# Patient Record
Sex: Female | Born: 1970 | Race: Black or African American | Hispanic: No | Marital: Married | Smoking: Never smoker
Health system: Southern US, Community
[De-identification: ages and names within clinical notes are randomized; demographics above are authoritative.]

## PROBLEM LIST (undated history)

## (undated) DIAGNOSIS — E119 Type 2 diabetes mellitus without complications: Secondary | ICD-10-CM

## (undated) DIAGNOSIS — R1900 Intra-abdominal and pelvic swelling, mass and lump, unspecified site: Secondary | ICD-10-CM

## (undated) DIAGNOSIS — L309 Dermatitis, unspecified: Secondary | ICD-10-CM

## (undated) DIAGNOSIS — Z9289 Personal history of other medical treatment: Secondary | ICD-10-CM

## (undated) DIAGNOSIS — I10 Essential (primary) hypertension: Secondary | ICD-10-CM

## (undated) DIAGNOSIS — R7303 Prediabetes: Secondary | ICD-10-CM

## (undated) DIAGNOSIS — E7800 Pure hypercholesterolemia, unspecified: Secondary | ICD-10-CM

## (undated) HISTORY — PX: ECTOPIC PREGNANCY SURGERY: SHX613

## (undated) HISTORY — DX: Intra-abdominal and pelvic swelling, mass and lump, unspecified site: R19.00

---

## 1998-03-25 ENCOUNTER — Emergency Department (HOSPITAL_COMMUNITY): Admission: EM | Admit: 1998-03-25 | Discharge: 1998-03-25 | Payer: Self-pay | Admitting: Emergency Medicine

## 1998-03-31 ENCOUNTER — Emergency Department (HOSPITAL_COMMUNITY): Admission: EM | Admit: 1998-03-31 | Discharge: 1998-03-31 | Payer: Self-pay | Admitting: Emergency Medicine

## 1999-05-03 ENCOUNTER — Emergency Department (HOSPITAL_COMMUNITY): Admission: EM | Admit: 1999-05-03 | Discharge: 1999-05-03 | Payer: Self-pay | Admitting: Emergency Medicine

## 1999-06-21 ENCOUNTER — Emergency Department (HOSPITAL_COMMUNITY): Admission: EM | Admit: 1999-06-21 | Discharge: 1999-06-21 | Payer: Self-pay | Admitting: Internal Medicine

## 2001-10-04 ENCOUNTER — Inpatient Hospital Stay (HOSPITAL_COMMUNITY): Admission: AD | Admit: 2001-10-04 | Discharge: 2001-10-06 | Payer: Self-pay | Admitting: Obstetrics & Gynecology

## 2001-10-04 ENCOUNTER — Encounter: Payer: Self-pay | Admitting: Emergency Medicine

## 2001-11-03 ENCOUNTER — Other Ambulatory Visit: Admission: RE | Admit: 2001-11-03 | Discharge: 2001-11-03 | Payer: Self-pay | Admitting: Obstetrics and Gynecology

## 2003-11-08 ENCOUNTER — Emergency Department (HOSPITAL_COMMUNITY): Admission: EM | Admit: 2003-11-08 | Discharge: 2003-11-08 | Payer: Self-pay | Admitting: Emergency Medicine

## 2003-12-14 ENCOUNTER — Other Ambulatory Visit: Admission: RE | Admit: 2003-12-14 | Discharge: 2003-12-14 | Payer: Self-pay | Admitting: Obstetrics and Gynecology

## 2004-04-13 ENCOUNTER — Emergency Department (HOSPITAL_COMMUNITY): Admission: EM | Admit: 2004-04-13 | Discharge: 2004-04-13 | Payer: Self-pay | Admitting: Family Medicine

## 2004-08-12 ENCOUNTER — Encounter (INDEPENDENT_AMBULATORY_CARE_PROVIDER_SITE_OTHER): Payer: Self-pay | Admitting: Specialist

## 2004-08-12 ENCOUNTER — Inpatient Hospital Stay (HOSPITAL_COMMUNITY): Admission: AD | Admit: 2004-08-12 | Discharge: 2004-08-17 | Payer: Self-pay | Admitting: Obstetrics and Gynecology

## 2004-09-30 HISTORY — PX: BOWEL RESECTION: SHX1257

## 2004-10-31 ENCOUNTER — Ambulatory Visit (HOSPITAL_COMMUNITY): Admission: RE | Admit: 2004-10-31 | Discharge: 2004-10-31 | Payer: Self-pay | Admitting: General Surgery

## 2004-12-20 ENCOUNTER — Inpatient Hospital Stay (HOSPITAL_COMMUNITY): Admission: RE | Admit: 2004-12-20 | Discharge: 2004-12-26 | Payer: Self-pay | Admitting: General Surgery

## 2004-12-20 ENCOUNTER — Encounter (INDEPENDENT_AMBULATORY_CARE_PROVIDER_SITE_OTHER): Payer: Self-pay | Admitting: Specialist

## 2005-01-15 ENCOUNTER — Emergency Department (HOSPITAL_COMMUNITY): Admission: EM | Admit: 2005-01-15 | Discharge: 2005-01-15 | Payer: Self-pay | Admitting: Family Medicine

## 2005-08-21 ENCOUNTER — Emergency Department (HOSPITAL_COMMUNITY): Admission: EM | Admit: 2005-08-21 | Discharge: 2005-08-21 | Payer: Self-pay | Admitting: Family Medicine

## 2006-01-09 ENCOUNTER — Emergency Department (HOSPITAL_COMMUNITY): Admission: EM | Admit: 2006-01-09 | Discharge: 2006-01-10 | Payer: Self-pay | Admitting: Emergency Medicine

## 2006-01-15 ENCOUNTER — Emergency Department (HOSPITAL_COMMUNITY): Admission: EM | Admit: 2006-01-15 | Discharge: 2006-01-15 | Payer: Self-pay | Admitting: Family Medicine

## 2006-04-26 ENCOUNTER — Emergency Department (HOSPITAL_COMMUNITY): Admission: EM | Admit: 2006-04-26 | Discharge: 2006-04-26 | Payer: Self-pay | Admitting: Emergency Medicine

## 2007-05-22 ENCOUNTER — Emergency Department (HOSPITAL_COMMUNITY): Admission: EM | Admit: 2007-05-22 | Discharge: 2007-05-22 | Payer: Self-pay | Admitting: Family Medicine

## 2007-10-02 ENCOUNTER — Emergency Department (HOSPITAL_COMMUNITY): Admission: EM | Admit: 2007-10-02 | Discharge: 2007-10-02 | Payer: Self-pay | Admitting: Emergency Medicine

## 2008-04-29 ENCOUNTER — Emergency Department (HOSPITAL_COMMUNITY): Admission: EM | Admit: 2008-04-29 | Discharge: 2008-04-29 | Payer: Self-pay | Admitting: Emergency Medicine

## 2009-03-24 ENCOUNTER — Emergency Department (HOSPITAL_COMMUNITY): Admission: EM | Admit: 2009-03-24 | Discharge: 2009-03-24 | Payer: Self-pay | Admitting: Family Medicine

## 2011-02-15 NOTE — Discharge Summary (Signed)
Renee Oconnor, Renee Oconnor NO.:  [id number]   MEDICAL RECORD NO.:  [id number]          PATIENT TYPE:  INP   LOCATION:  0462                         FACILITY:  [facility]   PHYSICIAN:  Angelia Mould. Derrell Lolling, M.D.DATE OF BIRTH:  07-15-1971   DATE OF ADMISSION:  12/20/2004  DATE OF DISCHARGE:  12/26/2004                                 DISCHARGE SUMMARY   FINAL DIAGNOSES:  Functioning sigmoid colostomy.   OPERATION PERFORMED:  Exploratory laparotomy with resection and closure of  colostomy December 20, 2004.   HISTORY:  This is a 40 year old black female who was taken to the operating  room on August 13, 2004 for ectopic pregnancy.  Ectopic pregnancy was  operated upon and a uterine fibroid was removed and in the process of  removing the uterine fibroid through the cul-de-sac a full thickness  laceration of the rectovaginal septum occurred.  This was a contaminated  wound with stool present.  I came to the operating room and assisted Dr.  Stefano Gaul with layered closure of the rectovaginal septum injury and then a  laparoscopic colostomy was performed to divert the fecal stream and allow  adequate healing of this complex repair.  She did well.  She had undergone  evaluation as an outpatient.  She has undergone a bowel prep at home and is  brought to the hospital electively for a colostomy closure.   PHYSICAL EXAMINATION:  GENERAL:  Morbidly obese black female in no distress,  pleasant, friendly.  NECK:  No adenopathy or mass.  LUNGS:  Clear to auscultation.  HEART:  Regular rate and rhythm.  No murmur.  PERIPHERAL PULSES:  Palpable.  ABDOMEN:  Morbidly obese, soft, and nontender.  Well healed trocar site.  Healthy colostomy in the left lower quadrant.  EXTREMITIES:  She moves all four extremities well without deformity.  Her  arms and legs are very large.   HOSPITAL COURSE:  On the day of admission patient was taken to the operating  room.  We performed diagnostic  laparoscopy, but found that we could not  complete the procedure laparoscopically due to extensive dense adhesions.  The case was converted to an open laparotomy and we did resect the colostomy  and perform a primary anastomosis.  That surgery went well.  Postoperatively  she did well.  She had an ileus for two or three days but then we were able  to get the NG tube out.  She began passing flatus, advanced in her diet and  activities and she was ready to go home on December 26, 2004.  At the time she  went home she was afebrile, tolerating a regular diet, having bowel  movements,  was comfortable.  Her wounds were healing fine.  Pathology report on her  resected colostomy stoma showed benign colonic tissue.  She was asked to  follow up with me in the office in five to six days for staple removal and  was given a prescription for Vicodin for pain.      HMI/MEDQ  D:  01/17/2005  T:  01/17/2005  Job:  [id number]   cc:  Janine Limbo, M.D.  [address]  [place]  [place] [address]  Fax: [phone]

## 2011-02-15 NOTE — H&P (Signed)
NAME:  Renee Oconnor, Renee Oconnor NO.:  [id number]   MEDICAL RECORD NO.:  [id number]          PATIENT TYPE:  INP   LOCATION:  0004                         FACILITY:  [facility]   PHYSICIAN:  Angelia Mould. Derrell Lolling, M.D.DATE OF BIRTH:  Sep 21, 1971   DATE OF ADMISSION:  12/20/2004  DATE OF DISCHARGE:                                HISTORY & PHYSICAL   CHIEF COMPLAINT:  I want my colostomy closed.   HISTORY OF PRESENT ILLNESS:  This is a 40 year old black female who has a  history of an ectopic pregnancy.  She was taken to the operating room on  August 13, 2004 by Dr. Stefano Gaul.  He removed an ectopic pregnancy of the  right fallopian tube and removed an uterine fibroid and noticed a huge left  hydrosalpinx.  In the process of removing the uterine fibroid through the  cul de sac, the patient sustained a full-thickness, complex rectovaginal  laceration in the absence of a bowel prep.  I came to the operating room at  that time and assisted Dr. Stefano Gaul, who performed a very nice multilayer  closure of the rectovaginal septum.  I then performed a laparoscopic sigmoid  colostomy.  She did well postoperatively and has felt fine.  Dr. Stefano Gaul  feels that the rectovaginal septum has healed nicely.  She has had a  flexible sigmoidoscopy which shows a long rectosigmoid stump up to 30 cm.  She has had a barium enema, which otherwise looked normal.  She is brought  to the operating room electively for closure of her colostomy.   PAST MEDICAL HISTORY:  1.  Ectopic pregnancy, August 13, 2004.  2.  Left hydrosalpinx, significant.  3.  She is gravida 2, para 1, abortus 1 female, otherwise no significant      medical or surgical problems other than morbid obesity.  4.  She does have borderline hypertension.   CURRENT MEDICATIONS:  1.  Hydrochlorothiazide 25 mg daily.  2.  Iron.   DRUG ALLERGIES:  None known.   SOCIAL HISTORY:  She denies the use of alcohol or tobacco.   FAMILY HISTORY:   Mother, father, brother, and sister are living and well.   REVIEW OF SYSTEMS:  All systems are reviewed and noncontributory, except as  described above .   PHYSICAL EXAMINATION:  VITAL SIGNS:  Height 5 feet 8.  Weight 270 pounds.  Temp 98, pulse 99, respirations 18, blood pressure 130/70.  GENERAL:  A morbidly obese black female in no distress.  Pleasant and  friendly.  HEENT:  Eyes:  Sclerae are clear.  Extraocular movements are intact.  Ears,  nose, mouth, throat, nose, lips, tongue, and oropharynx without gross  lesions.  NECK:  Soft and nontender.  Short.  No palpable mass.  LUNGS:  Clear to auscultation.  No chest wall tenderness.  BREASTS:  Not examined.  HEART:  Regular rate and rhythm.  No murmur.  Radial and femoral pulses are  palpable.  ABDOMEN:  Morbidly obese.  Soft and nontender.  Well-healed trocar sites.  Healthy colostomy in the left lower quadrant.  EXTREMITIES:  Her arms and legs are  very large.  She moves them well without  deformity.  NEUROLOGIC:  No gross motor or sensory deficits.   ASSESSMENT:  1.  Functioning sigmoid colostomy, status post temporary diversion.  2.  History of ectopic pregnancy with rectovaginal septum injury.  3.  Morbid obesity.   PLAN:  The patient will be taken to the operating room for elective  laparoscopy and colostomy closure.  She has undergone a bowel prep at home.      HMI/MEDQ  D:  12/20/2004  T:  12/20/2004  Job:  [id number]   cc:   Janine Limbo, M.D.  [address], [address]  [place]  [place] [address]  Fax: [phone]

## 2011-02-15 NOTE — Consult Note (Signed)
[facility]  Patient:    Renee Oconnor, Renee Oconnor Visit Number: [id number] MRN: [id number]          Service Type: EMS Location: ED Attending Physician:  Shelba Flake Dictated by:   Maris Berger. Pennie Rushing, M.D. Proc. Date: 10/04/01 Consultation Date: 10/04/01 Admit Date:  10/03/2001 Discharge Date: 10/04/2001                            Consultation Report  DATE OF BIRTH:  11-Jul-1971  HISTORY OF PRESENT ILLNESS:  The patient is a 40 year old black married female gravida 1, para 0 who presented to the [facility] Emergency Department at 8:10 on the evening of October 03, 2001.  I was called at 10:30 on the morning of October 04, 2001 with a report of patient with the presentation of right lower quadrant pain, positive pregnancy test, and no intrauterine pregnancy on ultrasound.  She was reported to have stable vital signs except for an admission temperature of 101, which had subsequently become afebrile.  On evaluation of the patient at that time, she was in no acute distress and actually denied any current abdominal pain.  She stated that her abdominal pain, which had actually begun on Friday, October 02, 2001, had been a 10/10, but after approximately an hour was resolved without any medication.  She now rates her pain at a 1-2/10.  She denies any nausea, vomiting, syncope.  PHYSICAL EXAMINATION:  VITAL SIGNS:  Her blood pressure was 131/86, her pulse was 82.  ABDOMEN:  The abdomen was soft without masses or organomegaly.  There was no specific tenderness.  There was mild rebound in the right lower quadrant. Bowel sounds were normal.  PELVIC:  EGBUS, showed a small amount of blood.  The cervix was tender to lateral motion.  The uterus did not feel enlarged but the examination was compromised by patient habitus.  Adnexa had no specific masses with mild tenderness, greater on the right than on the left.  LABORATORY STUDIES:   Hemoglobin 12.4, white blood cell count 7.7, beta-hCG 1597.  IMPRESSION:  Probable right tubal ectopic pregnancy.  DISPOSITION:  A decision is made to transfer the patient to [facility] to further manage her probable gynecologic illness.  The patient and her husband were apprised of this recommendation and agreed to use [facility] for transfer to maternity admissions at [facility]. Dictated by:   Maris Berger. Pennie Rushing, M.D. Attending Physician:  Shelba Flake DD:  10/04/01 TD:  10/05/01 Job: [id number]

## 2011-02-15 NOTE — Op Note (Signed)
NAME:  Renee Oconnor, Renee Oconnor NO.:  [id number]   MEDICAL RECORD NO.:  [id number]          PATIENT TYPE:  INP   LOCATION:  0004                         FACILITY:  [facility]   PHYSICIAN:  Angelia Mould. Derrell Lolling, M.D.DATE OF BIRTH:  08-21-1971   DATE OF PROCEDURE:  12/20/2004  DATE OF DISCHARGE:                                 OPERATIVE REPORT   PREOPERATIVE DIAGNOSIS:  Functioning sigmoid colostomy.   POSTOPERATIVE DIAGNOSIS:  Functioning sigmoid colostomy.   OPERATION PERFORMED:  Diagnostic laparoscopy, exploratory laparotomy with  extensive lysis of adhesions, resection and closure of colostomy.   SURGEON:  Dr. Claud Kelp   FIRST ASSISTANT:  Dr. Avel Peace   OPERATIVE INDICATIONS:  This is a 40 year old black female, who presented 4  months ago to [facility] with an ectopic pregnancy.  She underwent  diagnostic laparoscopy and resection of her ectopic pregnancy on the right  side.  She was also found to have a uterine fibroid which was resected and  was noted to have a very large hydrosalpinx on the left.  Dr. Stefano Gaul  brought the specimens out through the cul-de-sac but in doing so, the  patient sustained a complex laceration of the rectovaginal septum with  unprepped bowel.  Dr. Stefano Gaul and I repaired that and then because of the  high risk of rectovaginal fistula, I performed a diverting colostomy  laparoscopically.  She did very well.  I have studied her rectum with both  proctoscope as well as a barium enema, and everything looks fine.  She is  brought to operating room electively following a bowel prep.   OPERATIVE FINDINGS:  The patient is morbidly obese which made visualization  with laparoscopy difficult.  She had extensive adhesions of the rectosigmoid  stump in the upper pelvis, being adherent to loops of small bowel.  There  was a loop of small bowel that was densely adherent to the umbilicus at the  trocar site, extremely fibrotic, and we were  unable to safely dissect this  with the laparoscope and for that reason, abandoned the laparoscopic  approach.  The rectal stump, when opened, had some slight cobblestoning of  the mucosa suggesting mild proctitis of disuse.  Blood supply to the rectal  stump and the sigmoid colon was fine, and the proximal colon mucosa was  normal.  The small bowel was otherwise normal.  The liver felt normal.  There was a huge left hydrosalpinx.  The right ovary looked basically  normal.   OPERATIVE TECHNIQUE:  Following the induction of general endotracheal  anesthesia, a Foley catheter was placed.  The abdomen was prepped and draped  in a sterile fashion.  Transverse incision was made just below the umbilicus  through the previous scar where the previous trocar was.  The fascia was  incised in the midline and the abdominal cavity entered under direct vision.  We saw that there was a loop of intestine just below this trocar site.  We  placed a 10 mm trocar in the upper midline and a couple of trocars on the  right side.  We tried to get around the  adhesion at the umbilicus and tried  to take this down, but is was so dense, it almost looked like it had  herniated into the abdominal wall, and so we abandoned the laparoscopic  approach at this point.  We pulled the trocars out and made a midline  incision, both above and below the umbilicus.  We dissected the loop of  small bowel out from under the umbilicus, and it was extremely fibrotic  adhesion.  But the small bowel was otherwise healthy, and there was no  injury to the small bowel.  We examined the rest the small bowel, and it  looked fine.  We went down in the pelvis and found the rectum and traced it  up to the distal sigmoid and dissected that away from the retroperitoneum  and the mesentery.  The adhesions there were also fairly intense, and we had  to very slowly dissect the rectosigmoid stump up.  Ultimately we had it  completely free and could  see the staple line where it had been divided.   We then made a transverse incision elliptically around the colostomy and  dissected it out of the subcutaneous tissue all the way down to the fascia.  We then dissected the colon all the way out of the fascia and returned it to  the abdominal cavity.  We resected about 4 inches of the colostomy because  that was how much was in her abdominal wall.  We simply placed a Satinsky  clamp across the bowel and transected the colon and removed it.  We  controlled the mesenteric vessels with clamps, divided them, and ligated  them with 2-0 silk ties.  We sent the colostomy remnant to the pathology  lab.   We then placed the Satinsky clamp on the rectosigmoid stump about 1 inch  below the staple line and transected that.  It looked healthy except for  some slight cobblestoning.   We set up the anastomosis.  We did it end-to-end with a single layer of 3-0  silks.  Inverting corner stitches of 3-0 silk were placed as holders.  We  then could close the back wall of the anastomosis with interrupted simple  sutures of 3-0 silk.  We inspected this very carefully and once we were  satisfied that we had good closure posteriorly, we cut all those sutures  except for the corner sutures.  We closed the corners with interrupted  inverting sutures of 3-0 silk and brought the anastomosis around anteriorly  and completed the anastomosis anteriorly with about 5-6 interrupted sutures  of 3-0 silk.  We checked all of the anastomosis.  The bowel looked perfectly  healthy.  The lumen was about 2 fingers wide and looked quite patent.   At this point, we changed our gowns, gloves, and instruments.  We copiously  irrigated the abdomen and pelvis with 2-3 L of saline.  We closed the  mesentery with interrupted sutures of 3-0 silk.  We took some of the mesenteric fat and appendices epiploica and sewed it down over the  anastomosis laterally and anteriorly.  There was no  tension on the  anastomosis, and the bowel was healthy.   At this point in the case, the nurses informed me that the sponge count was  incorrect.  There were 5 sponges unaccounted for which is the number sponges  in an entire pack.  Dr. Abbey Chatters and I explored the abdomen thoroughly on  several occasions and could find no sponges.  We took three separate x-rays  of the abdomen and pelvis, and there were no radiopaque foreign bodies.  It  was our opinion that this was a documentation error and that there was no  retained sponge in the patient.   We closed the peritoneum under the colostomy site with a running suture of 2-  0 Vicryl.  We closed the fascia at the colostomy site with five interrupted  sutures of #1 Novofil.  The midline fascia was closed with a running suture  of  #1 PDS.  The wounds were copiously irrigated.  The skin incision were all  closed with skin staples.  Clean bandages were placed and the patient taken  to the recovery room in stable condition.  Estimated blood loss was about  250 mL.  Complications were none.  Sponge and instrument counts were  correct.      HMI/MEDQ  D:  12/20/2004  T:  12/20/2004  Job:  [id number]   cc:   Janine Limbo, M.D.  [address]  [place]  [place] [address]  Fax: [phone]

## 2011-02-15 NOTE — Op Note (Signed)
Renee Oconnor, Renee Oconnor NO.:  [id number]   MEDICAL RECORD NO.:  [id number]          PATIENT TYPE:  INP   LOCATION:  9319                          FACILITY:  [facility]   PHYSICIAN:  Angelia Mould. Derrell Lolling, M.D.DATE OF BIRTH:  07-18-1971   DATE OF PROCEDURE:  08/13/2004  DATE OF DISCHARGE:                                 OPERATIVE REPORT   PREOPERATIVE DIAGNOSIS:  Ectopic pregnancy.   POSTOPERATIVE DIAGNOSES:  1.  Ectopic pregnancy, right fallopian tube.  2.  Uterine fibroids.  3.  Complex rectovaginal laceration.   OPERATION PERFORMED:  1.  Intraoperative general surgery consultation.  2.  Rigid proctoscopy (post repair).  3.  Laparoscopic sigmoid colostomy.   SURGEON:  Angelia Mould. Derrell Lolling, M.D.   FIRST ASSISTANT:  Janine Limbo, M.D.   OPERATIVE INDICATION:  This is a 40 year old black female, who presented to  Dr. Stefano Gaul today with her second ectopic pregnancy.  She has been known to  have pelvic adhesions.  Dr. Stefano Gaul brought her to the operating room for  management of her ectopic pregnancy.  He performed a right salpingectomy.  He then performed a myomectomy for a uterine fibroid on the fundus of the  uterus.  In the process of performing a colpotomy to remove the fibroid, the  patient sustained a complex full-thickness rectovaginal laceration.  Dr.  Stefano Gaul asked me to come to the operating room to assist.   When I arrived in the operating room, Dr. Stefano Gaul showed me the injury.  There was about a 3 cm diameter irregular full-thickness laceration on the  posterior vaginal wall just below the cervix, full-thickness into the rectal  wall.  I scrubbed in and assisted him with the primary repair.  He performed  a very nice primary repair in three layers.   During the procedure I performed a rigid proctoscopy and found that the  repair was airtight and that the rectal wall looked good without any  stricturing or narrowing of the rectal lumen, and there was  no bleeding.  I  actually performed a rigid proctoscopy to above 20 cm, irrigating out all of  the stool completely from the rectosigmoid colon that I could see.  I  completed the proctoscopy and once again looked at the repair, and it looked  perfectly fine without any narrowing of the lumen.   At this point Dr. Stefano Gaul completed the layered repair of the rectovaginal  injury, irrigated out the vagina with dilute Betadine solution, and placed a  tenaculum on the cervix.   We then reprepped and redraped the patient to expose all of the abdomen.  There was a fresh transverse incision below the umbilicus where he had  performed an open-technique laparoscopy.  We opened this incision and  removed all of the sutures.  We placed a pursestring suture of 0 Vicryl and  then inserted a Hasson trocar and insufflated the abdomen to 15 mmHg.  The  video camera was inserted.  There was some bloody, watery fluid down in the  pelvis, but there was no fecal contamination.  We copiously irrigated the  pelvis.  We  noticed that the area of the myomectomy was perfectly hemostatic  on the fundus of the uterus.  The area of the right salpingectomy looked  fine.  The left tube and ovary were quite dilated with a huge chronic  hydrosalpinx.  We spent a great deal of time inspecting the cul-de-sac, and  everything looked fine, just a little bit of blood-staining from the repair.   I placed a 12 mm trocar in the lower midline and a 12 mm trocar above the  umbilicus in the midline.  Dr. Stefano Gaul had placed two 5 mm trocars in the  left and right suprapubic areas, and I ultimately had to insert 5 mm trocars  in these locations as well.   We identified the rectum and traced it proximally.  There were some small  bowel adhesions extending over to the left pelvic sidewall, and I had to use  the Harmonic scalpel to mobilize this completely to get the small bowel out  of the way.  I then rechecked the anatomy,  tracing the rectum all the way  proximally through the sigmoid colon up to the distal descending colon.  We  identified the cecum as well.   I mobilized the sigmoid colon by dividing its lateral peritoneal attachments  using the Harmonic scalpel.  After I had the sigmoid colon fully mobilized,  I created a window in the distal sigmoid colon mesentery with the Harmonic  scalpel and divided the distal sigmoid colon with a GIA stapling device.  We  took down the mesentery just a little bit to further mobilize the sigmoid  colon.  We scored the mesentery a little bit more and then ultimately we had  excellent mobilization of the sigmoid colon.   I did create a colostomy.  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  I felt that diversion was the safest course  considering the complex nature of the repair and the unprepped bowel.   I selected a site on the abdominal wall to the left of the umbilicus.  A  circular button of skin was excised and the underlying fat was dissected  away as well.  I incised the anterior rectus sheath in the colostomy site  with electrocautery.  The rectus sheath was incised in a cruciate fashion.  The rectus muscle was simply retracted medially and laterally.  The  posterior rectus sheath was likewise incised with electrocautery until I  could easily pass two fingers through the opening.  The sigmoid colon was  delivered through the colostomy site.  Great care was taken to avoid  twisting of the colostomy; in fact, this was inspected both externally and  internally on two or three occasions and a minor adjustment was made to  ensure that there was no twisting.  The colon was pink and healthy.  I  amputated the staple line from the sigmoid colon and then matured the colostomy with about 10 interrupted sutures of 3-0 Vicryl.  The colon mucosa  was perfectly pink, bled well,  and was quite healthy.  I was able to pass my  finger down through the colostomy into the abdomen.   A 19 [place] Blake drain was placed in the pelvis in the cul-de-sac.  This  was brought out through the right suprapubic trocar site and sutured to the  skin with nylon suture and connected to the suction bulb.  We made one final inspection of all areas of surgery and dissection.  The  small bowel and colon and retroperitoneum looked fine without any bleeding.  The trocars were removed under direct vision, and there was no bleeding from  the trocar sites.  The fascia at the umbilicus was closed with about five  interrupted sutures of 0 Vicryl.  The wounds were irrigated with saline and  closed with skin staples.  A colostomy bag was placed on the sigmoid  colostomy.  Clean bandages were placed and the patient was taken to the  recovery room in stable condition.  Estimated blood loss was probably around  100 mL.  Complications:  None.  Sponge, needle, and instrument counts were  correct.     Hayw   HMI/MEDQ  D:  08/13/2004  T:  08/13/2004  Job:  [id number]   cc:   Janine Limbo, M.D.  [address], [address]  [place]  [place] [address]  Fax: [phone]

## 2011-02-15 NOTE — Discharge Summary (Signed)
[facility]  Patient:    Renee Oconnor, Renee Oconnor Visit Number: [id number] MRN: [id number]          Service Type: MED Location: [id number] Attending Physician:  Shaune Spittle Dictated by:   Henreitta Leber, P.A. Admit Date:  10/04/2001 Discharge Date: 10/06/2001                             Discharge Summary  DISCHARGE DIAGNOSES: 1. Right tubal pregnancy. 2. Chronic pelvic inflammatory disease. 3. Elevated blood pressure. 4. Infertility.  PROCEDURES:  During the course of the patients hospital stay she received intravenous antibiotics and methotrexate.  HISTORY OF PRESENT ILLNESS:  Renee Oconnor is a 40 year old married black female, gravida 1, para 0, who presented to [facility] Emergency Department on the evening of October 03, 2001, due to severe right lower quadrant abdominal pain, a positive pregnancy test, and no intrauterine pregnancy on ultrasound.  She was then transferred to [facility] for further evaluation and management of her signs and symptoms. Please see the patients dictated history and physical examination for details.  PHYSICAL EXAMINATION:  VITAL SIGNS:  Blood pressure 131/86, pulse 82, temperature 99.6.  ABDOMEN:  The abdomen was soft without masses or organomegaly.  There was no specific tenderness.  There was mild rebound in the right lower quadrant. Bowel sounds were normal.  PELVIC:  EGBUS showed a small amount of blood.  The cervix was tender to lateral motion.  Uterus did not feel enlarged, but the examination was compromised by the patients habitus.  Adnexa had no specific masses with mild tenderness, greater on the right than on the left.  LABORATORY DATA:  Beta hCG of 1597, blood type A+.  Ultrasound did not reveal an intrauterine pregnancy, there was endometrial thickness of 6 mm; right ovary was normal; left adnexal mass consistent with hydrosalpinx with simple fluid  contents, 6 cm x 4 cm fluid collection on left without Doppler flow, and without yolk sac.  The patients hemoglobin was 12.4, white blood cell count 7.7, with 70% leukocytes.  HOSPITAL COURSE:  During the course of hospital stay, the patient continued to have intermittent abdominal pain with small amounts of vaginal bleeding.  She was noted to have a decrease in her quantitative Hcg on 10/06/01, to a level of 974.  After discussing options of management with the patient and her husband, she consented to methotrexate therapy.  Following its administration, the patient was discharged home to follow up with Dr. Pennie Rushing in two weeks.  DISCHARGE MEDICATIONS:  Doxycycline 100 mg p.o. b.i.d. x7 days.  FOLLOWUP:  The patient is to present to [facility] for quantitative Hcg on 10/09/01, 10/12/01, 10/19/01, 10/26/01, and follow up with Dr. Pennie Rushing on 10/21/01, at 1:30 p.m.  DISCHARGE INSTRUCTIONS:  The patient was advised to call [facility] for a temperature greater than 101, abdominal pain which is not relieved by over-the-counter ibuprofen.  She was further advised to place nothing in her vaginal vault (i.e. no douching, tampons, or intercourse).  The patient was instructed that she may return to work on 10/12/01. Dictated by:   Henreitta Leber, P.A. Attending Physician:  Shaune Spittle DD:  10/14/01 TD:  10/15/01 Job: [id number]

## 2011-02-15 NOTE — Discharge Summary (Signed)
[name], HEADINGS NO.:  [id number]   MEDICAL RECORD NO.:  [id number]          PATIENT TYPE:  INP   LOCATION:  9319                          [facility]ILITY[facility]   PHYSICIAN:  Janine Limbo, M.D.DATE OF BIRTH:  [birth year]   DATE OF ADMISSION:  08/12/2004  DATE OF DISCHARGE:  08/17/2004                                 DISCHARGE SUMMARY   DISCHARGE DIAGNOSES:  1.  Right ectopic pregnancy.  2.  Anemia.  3.  Hemoperitoneum.  4.  Pelvic adhesions.  5.  Fibroid uterus.  6.  Incidental transvaginal colotomy.  7.  Obesity.   OPERATION AND PROCEDURES:  On the date of admission, the patient underwent a  diagnostic laparoscopy with a partial right salpingectomy, lysis of  adhesions, and myomectomy.  The patient also underwent a colpotomy to remove  the fibroid from the pelvis, at which time an incidental colotomy was  encountered and repaired.  The patient underwent a repeat laparoscopy,  proctoscopy, and a laparoscopic sigmoid colostomy.   HISTORY OF PRESENT ILLNESS:  [name] is a 40 year old married  African-American female gravida 2 para 0-0-1-0 who presented to maternity  admissions unit at [facility] [place] on August 12, 2004  complaining of abdominal cramping and vaginal spotting.  The patient had a  previous history of ectopic pregnancy along with pelvic inflammatory  disease, and therefore was admitted for evaluation due to the fact that her  pregnancy test was positive.  Please see the patient's complete history on  her permanent record.   ADMISSION PHYSICAL EXAMINATION:  VITAL SIGNS:  Blood pressure 166/71, pulse  is 97, respirations 20, temperature 97.4 degrees Fahrenheit orally.  ABDOMEN:  The abdomen was soft with mild tenderness.  BACK:  Without CVA tenderness.  PELVIC:  External genitalia was normal, no lesions.  The cervix was long and  closed without motion tenderness.  Adnexa had tenderness on the right.  Vagina  revealed a small amount of blood in the vaginal vault.  The uterus  was not enlarged, though due to the patient's body habitus, it was difficult  to assess.   The patient's blood type is A positive.   HOSPITAL COURSE:  On the date of admission, the patient underwent  aforementioned procedures, tolerating them all well.  Operative findings  included a right ectopic pregnancy, pelvic adhesions, and a uterine fibroid.  An incidental colotomy was discovered and repaired.  A laparoscopic  colostomy was performed.  Postoperative course was also unremarkable, with  the patient tolerating a hemoglobin of 10.8 (preoperative hemoglobin 11.8).  By hospital day #5/postoperative day #4 the patient had resumed bladder  function, was tolerating a regular diet, had stool emerging from her  colostomy stoma, and was therefore deemed ready for discharge home.   DISCHARGE MEDICATIONS:  1.  Ibuprofen 600 mg one tablet with food q.6h. for 3 days, then as needed      for pain.  2.  Ferrous sulfate 325 mg one tablet twice daily for 6 weeks.   FOLLOW-UP:  The patient is scheduled for a follow-up visit with Dr. Stefano Gaul  on September 05, 2004 at 10:30 a.m.  She also was to call Dr. Derrell Lolling in 10-14  days for follow-up appointment at his office, [phone].   DISCHARGE INSTRUCTIONS:  The patient was advised to call [facility] at [phone] for any temperature greater than 100.4 degrees Fahrenheit  orally, severe pain, or concerns.  The patient's diet is without  restrictions.  Her activity is as tolerated.   FINAL PATHOLOGY:  1.  Pelvic adhesions:  Peritoneum with fibrosis consistent with adhesions.  2.  Right ectopic pregnancy:  Fallopian tube with chorionic villi consistent      with tubal ectopic pregnancy.  3.  Fibroid leiomyoma.     Elmi   EJP/MEDQ  D:  08/17/2004  T:  08/18/2004  Job:  [id number]   cc:   Angelia Mould. Derrell Lolling, M.D.  [address]  [place]  [place] [address]  Fax:  [phone]

## 2011-02-15 NOTE — H&P (Signed)
[facility]  Patient:    Renee Oconnor, Renee Oconnor Visit Number: [id number] MRN: [id number]          Service Type: EMS Location: ED Attending Physician:  Shelba Flake Dictated by:   Maris Berger. Pennie Rushing, M.D. Admit Date:  10/03/2001 Discharge Date: 10/04/2001                           History and Physical  HISTORY OF PRESENT ILLNESS:   The patient is a 40 year old married black female, gravida 1, para 0, without presented to the [facility] Emergency Department on October 03, 2001 at 8:10 p.m.  He was complaining of a two-day history of abdominal pain.  She had acute onset of right lower quadrant pain while working that she rated at 10/10, unassociated with nausea, vomiting, constipation, diarrhea or syncope.  She was at work at the time, and was able to be seated.  By the time her husband had arranged for her to return home approximately one hour later, she was without any abdominal pain at all.  She had taken no pain medication.  She had recurrence of the pain later on Friday evening and then once on Saturday, but no recurrence of that pain.  She presented to the emergency room because she could still feel tenderness on pressing on the right lower quadrant, and felt that she needed to evaluate what was going on.  She has no history of a prior pregnancy, and is using no contraception.  She has never used contraception.  Her last normal menstrual period was August 29, 2001, and she began having some bleeding in mid December, which was not a normal period, but which spotted off and on, and she continues that until this time.  PAST OBSTETRIC HISTORY:       Negative.  PAST GYNECOLOGIC HISTORY:     Her last Pap smear was in 1987, as was her last pelvic examination.  She had menarche at the age of 53, with regular monthly menses, never missing any menses and never having intermenstrual bleeding. She and her husband have been married  for nine years, and they do desire pregnancy, and have never used any form of contraception.  PAST MEDICAL HISTORY:         Negative.  PAST SURGICAL HISTORY:        Negative.  HOSPITALIZATIONS:             None.  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  CURRENT MEDICATIONS:          None.  DRUG SENSITIVITIES:           None.  REVIEW OF SYSTEMS:            Completely negative except as mentioned above for the right lower quadrant pain.  Specifically, she denies any dysuria, change in bowel habits or dyspareunia.  PHYSICAL EXAMINATION:  VITAL SIGNS:                  Temperature 99.6 (down from 101 at [facility]), blood pressure and pulse as recorded on the nursing note and without orthostatic change.  GENERAL:                      Well-developed, obese black female in no acute distress.  LUNGS:  Clear.  HEART:                        Regular rate and rhythm.  ABDOMEN:                      Soft without masses or tenderness to deep palpation.  There is some mild rebound in the right lower quadrant.  Bowel sounds are normal.  PELVIC:                       EGBUS shows a small amount of blood.  The cervix is tender to lateral motion.  The uterus does not feel enlarged, though the examination is compromised by patient habitus.  Adnexa show no palpable masses.  There is some mild tenderness, greater on the right than on the left.  LABORATORY DATA:              HCG 1597.  Hemoglobin 12.4, white blood cell count 7.7 with 70% polys.  Urinalysis is negative.  Blood type is A positive. Wet prep is negative.  Ultrasound shows no intrauterine pregnancy with an endometrial thickness of 6 mm.  The right ovary appears normal.  The left adnexa has a serpiginous mass consistent with a hydrosalpinx with simple fluid contents.  There is a 6 x 4 cm fluid collection in the left adnexa that has no Doppler  flow associated with it and no yolk sac.  This could be consistent with a tubo-ovarian complex.  IMPRESSION:                   1. Probable tubal ectopic pregnancy, most likely                                  on the right side where the patients right                                  lower quadrant pain exists.                               2. Left adnexal mass, probably inflammatory in                                  nature, with no ultrasound characteristics                                  consistent with ectopic pregnancy.                               3. No evidence of an acute abdomen.                               4. Hemodynamically stable.  DISPOSITION:                  A long discussion was held with the patient and her husband concerning options for management, which include surgical intervention with laparoscopy with the attendant risks of anesthesia, bleeding, infection  and damage to adjacent organs, specifically damage to reproductive organs in a patient who is nulliparous and desirous of subsequent pregnancy.  The second option (which was accepted by the patient) was methotrexate treatment of a probable tubal ectopic pregnancy and intravenous antibiotic therapy for a probable chronic pelvic inflammatory process.  The patient will be observed in the hospital for therapy of her pelvic inflammatory disease for approximately 48 hours, at which time a reassessment of her HCG level as well as her pelvic pain will be undertaken.  It is understood that pelvic examination is contraindicated after methotrexate therapy so that this will be delayed until a repeat pelvic examination and ultrasound evaluation can be performed.  The risk of rupture of tubal ectopic pregnancy was explained to the patient and she and her husband seem to understand, but wish to proceed with conservative management. Dictated by:   Maris Berger. Pennie Rushing, M.D. Attending Physician:  Shelba Flake DD:   10/04/01 TD:  10/04/01 Job: [id number]

## 2011-02-15 NOTE — Op Note (Signed)
NAMEALLEYNE, LAC NO.:  [id number]   MEDICAL RECORD NO.:  [id number]          PATIENT TYPE:  INP   LOCATION:  9319                          [facility]   PHYSICIAN:  Janine Limbo, M.D.DATE OF BIRTH:  08-03-1971   DATE OF PROCEDURE:  DATE OF DISCHARGE:                                 OPERATIVE REPORT   PREOPERATIVE DIAGNOSES:  1.  Right ectopic pregnancy.  2.  Anemia (hemoglobin 11.8).  3.  Obesity (weight 269 pounds).   POSTOPERATIVE DIAGNOSES:  1.  Right ectopic pregnancy.  2.  Anemia (hemoglobin 11.8).  3.  Obesity (weight 269 pounds).  4.  Hemoperitoneum.  5.  Pelvic adhesions.  6.  Fibroid uterus.  7.  Incidental colotomy.   OPERATION PERFORMED:  1.  Diagnostic laparoscopy.  2.  Laparoscopic right partial salpingectomy.  3.  Laparoscopic lysis of adhesions.  4.  Laparoscopic myomectomy.  5.  Culpotomy with repair.  6.  Repair of incidental colotomy.  7.  Proctoscopy.  8.  Repeat laparoscopy.  9.  Laparoscopic sigmoid colostomy.   SURGEON:  Janine Limbo, M.D.   INTRAOPERATIVE CONSULT:  Angelia Mould. Derrell Lolling, M.D.   ASSISTANT:   ANESTHESIA:  General.   COMPLICATIONS:   INDICATIONS FOR PROCEDURE:  Ms. Carreno is a 40 year old female, gravida 2,  para 0-0-1-0, who presents with abdominal pain and a positive pregnancy  test.  An ultrasound showed a viable right ectopic pregnancy.  The patient  had an ectopic pregnancy in 2003 that was treated with methotrexate.  She  has a history of infertility and a history of pelvic inflammatory disease.  The patient understands the indications for her surgical procedure and she  accepts the risk of, but not limited to, anesthetic complications, bleeding,  infections and possible damage to the surrounding organs.   FINDINGS:  The uterus was upper limits normal size.  There was a 2 to 3 cm  fibroid on the right fundus of the uterus.  Upon inspecting the pelvis, we  found 150 mL of  hemoperitoneum.  There was a bleeding right ectopic  pregnancy present.  The right fallopian tube was dilated throughout its  course.  There were dense adhesions in the pelvis.  There were also dense  adhesions between the bowel, the left pelvic sidewall, and the bowel and the  right pelvic sidewall.  The left fallopian tube and the left ovary were  densely adhered to the left pelvic sidewall.  There were dense adhesions  present in the posterior cul-de-sac.  There was a hydrosalpinx present on  the left and the fimbriated end of the left tube could not be identified.  There was a 2 to 3 cm incidental colotomy that was discovered at the time of  posterior culpotomy.   DESCRIPTION OF PROCEDURE:  The patient was taken to the operating room where  a general anesthetic was given.  The patient's abdomen, perineum, and vagina  were prepped with multiple layers of Betadine.  A Foley catheter was placed  in the bladder.  Examination under anesthesia was performed.  A single  toothed tenaculum was placed  on the cervix just in case there was a viable  intrauterine pregnancy present.  The patient was then sterilely draped.  The  subumbilical area was injected with 6 mL of 0.5% Marcaine with epinephrine.  A subumbilical incision was made and carried sharply through the  subcutaneous tissue, the fascia, and the anterior peritoneum.  The Hasson  cannula was sutured into place using 2-0 Vicryl.  A pneumoperitoneum was  obtained.  The laparoscope was inserted.  The pelvis was visualized with the  findings as mentioned above.  Two suprapubic areas were injected with a  total of 4 mL of 0.25% Marcaine with epinephrine.  Two small incisions were  made and two 5 mm trocars were placed in the lower abdomen under direct  visualization.  Pictures were taken of the patient's pelvic anatomy.  We  then began to lyse the adhesions in the pelvis using a combination of sharp  dissection and blunt dissection.  We were  able to completely mobilize the  right fallopian tube.  The right ovary was identified.  Again, pictures were  taken.  The right fallopian tube and the mesosalpinx were then cauterized  using the bipolar cautery. Care was taken not to damage any of the  underlying structures.  The right fallopian tube was then removed.  The  ectopic pregnancy was removed through the umbilical port.  Because the  patient desired fertility, we elected to try to lyse the adhesions of the  left ovary and the left tube so that the left ovary would be more accessible  in the future.  This was accomplished using a combination of sharp and blunt  dissection.  The fibroid on the right fundus of the uterus was then  isolated.  The base of the fibroid was cauterized using the bipolar cautery.  The fibroid was sharply dissected from the fundus of the uterus.  Hemostasis  was achieved using the bipolar cautery.  Attempts were made to remove the  fibroid through the umbilical port but these attempts were unsuccessful.  The posterior cul-de-sac was then freed from adhesions using a combination  of sharp and blunt dissection.  Again, pictures were taken.  The fibroid was  placed in the posterior cul-de-sac and the decision was made to remove the  fibroid through a posterior colpotomy.  The pelvis was vigorously irrigated.  Again hemostasis was noted to be adequate at this point.  The bowel was  inspected and there was no evidence of trocar damage.  The suprapubic  trocars were removed under direct visualization.  The umbilical trocar was  then removed.  The fascia from the umbilical incision was closed using  figure-of-eight sutures of 2-0 Vicryl.  The subcutaneous layer was closed  using 2-0 Vicryl.  The skin was reapproximated using a subcuticular suture  of 4-0 Vicryl.  The suprapubic ports were closed using a subcuticular suture  of 4-0 Vicryl.  The patient was then placed in a more lithotomy position. The vagina was  again prepped with Betadine.  A paracervical block was placed  using 10 mL of 0.5% Marcaine with epinephrine.  The posterior cervix and the  upper vagina was then injected with 10 mL of 0.5% Marcaine with epinephrine.  A culpotomy was then made.  It was at this point that we noticed that there  was stool  in the incision and we noticed that there was an incidental  colotomy.  The vagina was vigorously irrigated as was the colotomy.  Angelia Mould.  Derrell Lolling, M.D. from the General Surgery department was called to assist in  the patient's surgery.  Dr. Derrell Lolling inspected the incision and he questioned  how I felt I could help.  I was comfortable repairing the laceration from  the vagina.  The margins of the rectal incision was then carefully isolated.  The rectal mucosa was closed using interrupted sutures of 2-0 silk, 3-0  silk, and 2-0 Vicryl.  Dr. Derrell Lolling then performed proctoscopy and he was able  to inspect the incision.  The incision appeared to be well approximated.  Dr. Derrell Lolling will dictate this portion of the operative note.  We also noted  that the incision was air tight and that there was no air passing from the  bowel into the vagina. The vagina was then vigorously irrigated with  Betadine and irrigation fluid.  Hemostasis was adequate.  The fascia propria  that overlay the rectum was then reapproximated in the midline using 2-0  Vicryl.  The vaginal mucosa was then closed using interrupted sutures of 2-0  Vicryl.  Again the vagina was then carefully irrigated with Betadine  solution and irrigation fluid.  Hemostasis was adequate.  Dr. Derrell Lolling was  then ready to repeat the laparoscopy and it was his opinion that the patient  would best be served by having a colostomy.  Again, Dr. Derrell Lolling will dictate  this portion of the operative note.  There was no stool in the pelvic  cavity.  The rectum appeared normal.  After the colostomy was performed  using the laparoscopic technique, the incisions  were closed using deep  sutures of 0 Vicryl on the fascia and then skin staples.  At the end of the  procedure, an x-ray was obtained and there was no evidence of instruments or  sponges left in the abdominal cavity.  The estimated blood loss for this  procedure was 100 mL.  The operative time for the procedure was  approximately seven hours.  A colostomy bag was placed.  The patient was  awakened from her anesthetic without difficulty.  She was then taken to the  recovery room in stable condition.     Arth   AVS/MEDQ  D:  08/13/2004  T:  08/13/2004  Job:  [id number]   cc:   Angelia Mould. Derrell Lolling, M.D.  [address], [address]  [place]  [place] [address]  Fax: [phone]

## 2011-06-14 ENCOUNTER — Inpatient Hospital Stay (INDEPENDENT_AMBULATORY_CARE_PROVIDER_SITE_OTHER)
Admission: RE | Admit: 2011-06-14 | Discharge: 2011-06-14 | Disposition: A | Discharge status: Home / Self Care | Payer: BC Managed Care – PPO | Source: Ambulatory Visit | Attending: Emergency Medicine | Admitting: Emergency Medicine

## 2011-06-14 DIAGNOSIS — I10 Essential (primary) hypertension: Secondary | ICD-10-CM

## 2012-01-18 ENCOUNTER — Encounter (HOSPITAL_COMMUNITY): Payer: Self-pay | Admitting: *Deleted

## 2012-01-18 ENCOUNTER — Emergency Department (INDEPENDENT_AMBULATORY_CARE_PROVIDER_SITE_OTHER): Payer: BC Managed Care – PPO

## 2012-01-18 ENCOUNTER — Emergency Department (INDEPENDENT_AMBULATORY_CARE_PROVIDER_SITE_OTHER)
Admission: EM | Admit: 2012-01-18 | Discharge: 2012-01-18 | Disposition: A | Discharge status: Home / Self Care | Payer: BC Managed Care – PPO | Source: Home / Self Care | Attending: Emergency Medicine | Admitting: Emergency Medicine

## 2012-01-18 DIAGNOSIS — M719 Bursopathy, unspecified: Secondary | ICD-10-CM

## 2012-01-18 DIAGNOSIS — M67919 Unspecified disorder of synovium and tendon, unspecified shoulder: Secondary | ICD-10-CM

## 2012-01-18 DIAGNOSIS — M7580 Other shoulder lesions, unspecified shoulder: Secondary | ICD-10-CM

## 2012-01-18 HISTORY — DX: Essential (primary) hypertension: I10

## 2012-01-18 HISTORY — DX: Pure hypercholesterolemia, unspecified: E78.00

## 2012-01-18 MED ORDER — DICLOFENAC SODIUM 75 MG PO TBEC: 75.0000 mg | DELAYED_RELEASE_TABLET | Freq: Two times a day (BID) | ORAL | Status: AC

## 2012-01-18 NOTE — ED Notes (Signed)
Pt with c/o right shoulder pain radiates around right shoulder increased pain with movement  No known injury

## 2012-01-18 NOTE — Discharge Instructions (Signed)
Rotator Cuff Tendonitis  The rotator cuff is the collection of all the muscles and tendons (the supraspinatus, infraspinatus, subscapularis, and teres minor muscles and their tendons) that help your shoulder stay in place. This unit holds the head of the upper arm bone (humerus) in the cup (fossa) of the shoulder blade (scapula). Basically, it connects the arm to the shoulder. Tendinitis is a swelling and irritation of the tissue, called cord like structures (tendons) that connect muscle to bone. It usually is caused by overusing the joint involved. When the tissue surrounding a tendon (the synovium) becomes inflamed, it is called tenosynovitis. This also is often the result of overuse in people whose jobs require repetitive (over and over again) types of motion. HOME CARE INSTRUCTIONS   Use a sling or splint for as long as directed by your caregiver until the pain decreases.   Apply ice to the injury for 15 to 20 minutes, 3 to 4 times per day. Put the ice in a plastic bag and place a towel between the bag of ice and your skin.   Try to avoid use other than gentle range of motion while your shoulder is painful. Use and exercise only as directed by your caregiver. Stop exercises or range of motion if pain or discomfort increases, unless directed otherwise by your caregiver.   Only take over-the-counter or prescription medicines for pain, discomfort, or fever as directed by your caregiver.   If you were give a shoulder sling and straps (immobilizer), do not remove it except as directed, or until you see a caregiver for a follow-up examination. If you need to remove it, move your arm as little as possible or as directed.   You may want to sleep on several pillows at night to lessen swelling and pain.  SEEK IMMEDIATE MEDICAL CARE IF:   Pain in your shoulder increases or new pain develops in your arm, hand, or fingers and is not relieved with medications.   You develop new, unexplained symptoms,  especially increased numbness in the hands or loss of strength, or you develop any worsening of the problems which brought you in for care.   Your arm, hand, or fingers are numb or tingling.   Your arm, hand, or fingers are swollen, painful, or turn white or blue.  Document Released: 12/07/2003 Document Revised: 09/05/2011 Document Reviewed: 07/14/2008 Exit[employer] Patient Information 2012 [employer]. 

## 2012-01-18 NOTE — ED Provider Notes (Signed)
Chief Complaint  Patient presents with  . Shoulder Pain    History of Present Illness:   Renee Oconnor is a 41 year old female who has had a two-day history of right shoulder pain. She describes the pain as encompassing a rim around the shoulder both posteriorly and anteriorly. She denies any injury, but does do a lot of lifting at work and also has a tendency to sleep on her right side with her arm under her pillow. There is no pain with abduction and she has a full range of motion the shoulder. It does not radiate down the arm and she denies any numbness, tingling, or weakness. She denies any neck pain or chest pain.  Review of Systems:  Other than noted above, the patient denies any of the following symptoms: Systemic:  No fevers, chills, sweats, or aches.  No fatigue or tiredness. Musculoskeletal:  No joint pain, arthritis, bursitis, swelling, back pain, or neck pain. Neurological:  No muscular weakness, paresthesias, headache, or trouble with speech or coordination.  No dizziness.   PMFSH:  Past medical history, family history, social history, meds, and allergies were reviewed.  Physical Exam:   Vital signs:  BP 173/94  Pulse 90  Temp(Src) 97.2 F (36.2 C) (Oral)  Resp 22  SpO2 98%  LMP 01/09/2012 Gen:  Alert and oriented times 3.  In no distress. Musculoskeletal: There is no pain to palpation. The shoulder has a full range of motion with no pain. Neer test was negative, Hawkins test was negative, empty cans test was positive, but muscle strength is normal. Otherwise, all joints had a full a ROM with no swelling, bruising or deformity.  No edema, pulses full. Extremities were warm and pink.  Capillary refill was brisk.  Skin:  Clear, warm and dry.  No rash. Neuro:  Alert and oriented times 3.  Muscle strength was normal.  Sensation was intact to light touch.   Radiology:  Dg Shoulder Right  01/18/2012  *RADIOLOGY REPORT*  Clinical Data: Shoulder pain for 2 days.  Limited range of motion  due to pain.  RIGHT SHOULDER - 2+ VIEW  Comparison: None.  Findings: The humerus is located and the acromioclavicular joint is intact.  There is no fracture.  No notable degenerative change. Imaged right lung and ribs are unremarkable.  IMPRESSION: Negative study.  Original Report Authenticated By: Bernadene Bell. Maricela Curet, M.D.    Assessment:  The encounter diagnosis was Rotator cuff tendonitis.  Plan:   1.  The following meds were prescribed:   New Prescriptions   DICLOFENAC (VOLTAREN) 75 MG EC TABLET    Take 1 tablet (75 mg total) by mouth 2 (two) times daily.   2.  The patient was instructed in symptomatic care, including rest and activity, elevation, application of ice and compression.  Appropriate handouts were given. 3.  The patient was told to return if becoming worse in any way, if no better in 3 or 4 days, and given some red flag symptoms that would indicate earlier return.   4.  The patient was told to follow up Dr. Trudee Grip if no better in 2 weeks. She was given exercises to do in the meantime.   Reuben Likes, MD 01/18/12 [phone]

## 2012-09-05 ENCOUNTER — Emergency Department (INDEPENDENT_AMBULATORY_CARE_PROVIDER_SITE_OTHER)
Admission: EM | Admit: 2012-09-05 | Discharge: 2012-09-05 | Disposition: A | Discharge status: Home / Self Care | Payer: BC Managed Care – PPO | Source: Home / Self Care | Attending: Family Medicine | Admitting: Family Medicine

## 2012-09-05 ENCOUNTER — Encounter (HOSPITAL_COMMUNITY): Payer: Self-pay | Admitting: *Deleted

## 2012-09-05 DIAGNOSIS — K089 Disorder of teeth and supporting structures, unspecified: Secondary | ICD-10-CM

## 2012-09-05 DIAGNOSIS — K0889 Other specified disorders of teeth and supporting structures: Secondary | ICD-10-CM

## 2012-09-05 MED ORDER — CLINDAMYCIN HCL 150 MG PO CAPS: 150.0000 mg | ORAL_CAPSULE | Freq: Four times a day (QID) | ORAL | Status: DC

## 2012-09-05 MED ORDER — DICLOFENAC POTASSIUM 50 MG PO TABS: 50.0000 mg | ORAL_TABLET | Freq: Three times a day (TID) | ORAL | Status: DC

## 2012-09-05 NOTE — ED Provider Notes (Signed)
History     CSN: [id number]  Arrival date & time 09/05/12  1233   First MD Initiated Contact with Patient 09/05/12 1436      Chief Complaint  Patient presents with  . Dental Pain    (Consider location/radiation/quality/duration/timing/severity/associated sxs/prior treatment) Patient is a 41 y.o. female presenting with tooth pain. The history is provided by the patient.  Dental PainThe primary symptoms include mouth pain. Primary symptoms do not include fever or sore throat. The symptoms began 2 days ago. The symptoms are worsening. The symptoms are new. The symptoms occur constantly.  Additional symptoms include: dental sensitivity to temperature and jaw pain. Additional symptoms do not include: trouble swallowing and pain with swallowing.    Past Medical History  Diagnosis Date  . Hypertension   . Diabetes mellitus   . High cholesterol     Past Surgical History  Procedure Date  . Ectopic pregnancy surgery     Family History  Problem Relation Age of Onset  . Family history unknown: Yes    History  Substance Use Topics  . Smoking status: Never Smoker   . Smokeless tobacco: Not on file  . Alcohol Use: No    OB History    Grav Para Term Preterm Abortions TAB SAB Ect Mult Living                  Review of Systems  Constitutional: Negative.  Negative for fever.  HENT: Positive for dental problem. Negative for sore throat and trouble swallowing.     Allergies  Review of patient's allergies indicates no known allergies.  Home Medications   Current Outpatient Rx  Name  Route  Sig  Dispense  Refill  . AMLODIPINE BESYLATE-VALSARTAN 10-160 MG PO TABS   Oral   Take 1 tablet by mouth daily.         Marland Kitchen CLINDAMYCIN HCL 150 MG PO CAPS   Oral   Take 1 capsule (150 mg total) by mouth 4 (four) times daily.   28 capsule   0   . DICLOFENAC POTASSIUM 50 MG PO TABS   Oral   Take 1 tablet (50 mg total) by mouth 3 (three) times daily.   15 tablet   0   .  DICLOFENAC SODIUM 75 MG PO TBEC   Oral   Take 1 tablet (75 mg total) by mouth 2 (two) times daily.   20 tablet   0   . METFORMIN HCL 500 MG PO TABS   Oral   Take 500 mg by mouth 2 (two) times daily with a meal.         . ROSUVASTATIN CALCIUM 10 MG PO TABS   Oral   Take 10 mg by mouth daily.           BP 159/81  Pulse 93  Temp 99 F (37.2 C) (Oral)  Resp 16  SpO2 100%  Physical Exam  Nursing note and vitals reviewed. Constitutional: She appears well-developed and well-nourished. She appears distressed.  HENT:  Head: Normocephalic.  Right Ear: External ear normal.  Left Ear: External ear normal.  Mouth/Throat: Oropharynx is clear and moist and mucous membranes are normal.      ED Course  Procedures (including critical care time)  Labs Reviewed - No data to display No results found.   1. Pain, dental       MDM          Linna Hoff, MD 09/05/12 1501

## 2012-09-05 NOTE — ED Notes (Signed)
Pt reports abscess tooth/dental pain on left side - is currently trying to get dental appointment for monday

## 2012-09-07 NOTE — ED Notes (Signed)
Patient c/o her pain and swelling is worse. Called to [facility] , oral surgery office, and they can see her tomorrow at 3:30pm. Called patient to discuss, and she will call to discuss appointmnet

## 2015-07-20 ENCOUNTER — Emergency Department (INDEPENDENT_AMBULATORY_CARE_PROVIDER_SITE_OTHER)
Admission: EM | Admit: 2015-07-20 | Discharge: 2015-07-20 | Disposition: A | Discharge status: Home / Self Care | Payer: BLUE CROSS/BLUE SHIELD | Source: Home / Self Care | Attending: Family Medicine | Admitting: Family Medicine

## 2015-07-20 ENCOUNTER — Emergency Department (INDEPENDENT_AMBULATORY_CARE_PROVIDER_SITE_OTHER): Payer: BLUE CROSS/BLUE SHIELD

## 2015-07-20 ENCOUNTER — Encounter (HOSPITAL_COMMUNITY): Payer: Self-pay | Admitting: Family Medicine

## 2015-07-20 DIAGNOSIS — K5909 Other constipation: Secondary | ICD-10-CM

## 2015-07-20 LAB — POCT URINALYSIS DIP (DEVICE)
Bilirubin Urine: NEGATIVE
Glucose, UA: NEGATIVE mg/dL
KETONES UR: NEGATIVE mg/dL
Nitrite: NEGATIVE
PH: 6 (ref 5.0–8.0)
Protein, ur: 100 mg/dL — AB
Specific Gravity, Urine: 1.015 (ref 1.005–1.030)
Urobilinogen, UA: 1 mg/dL (ref 0.0–1.0)

## 2015-07-20 LAB — POCT PREGNANCY, URINE: PREG TEST UR: NEGATIVE

## 2015-07-20 MED ORDER — PEG 3350-KCL-NA BICARB-NACL 420 G PO SOLR: 4000.0000 mL | Freq: Once | ORAL | Status: DC

## 2015-07-20 NOTE — ED Notes (Signed)
C/o mid abd pain for a week now States belly feels tight Has not had a good bowel movement

## 2015-07-20 NOTE — Discharge Instructions (Signed)
Her symptoms are likely from constipation. Please stay at home tomorrow and use the GoLYTELY over the course of a couple of hours in order to promote your bowels to move. For approximately 1 week after this use MiraLAX every morning and use plenty of yogurt over the next several weeks to promote replenishment of her gut bacteria.   Constipation, Adult Constipation is when a person has fewer than three bowel movements a week, has difficulty having a bowel movement, or has stools that are dry, hard, or larger than normal. As people grow older, constipation is more common. A low-fiber diet, not taking in enough fluids, and taking certain medicines may make constipation worse.  CAUSES   Certain medicines, such as antidepressants, pain medicine, iron supplements, antacids, and water pills.   Certain diseases, such as diabetes, irritable bowel syndrome (IBS), thyroid disease, or depression.   Not drinking enough water.   Not eating enough fiber-rich foods.   Stress or travel.   Lack of physical activity or exercise.   Ignoring the urge to have a bowel movement.   Using laxatives too much.  SIGNS AND SYMPTOMS   Having fewer than three bowel movements a week.   Straining to have a bowel movement.   Having stools that are hard, dry, or larger than normal.   Feeling full or bloated.   Pain in the lower abdomen.   Not feeling relief after having a bowel movement.  DIAGNOSIS  Your health care provider will take a medical history and perform a physical exam. Further testing may be done for severe constipation. Some tests may include:  A barium enema X-ray to examine your rectum, colon, and, sometimes, your small intestine.   A sigmoidoscopy to examine your lower colon.   A colonoscopy to examine your entire colon. TREATMENT  Treatment will depend on the severity of your constipation and what is causing it. Some dietary treatments include drinking more fluids and eating  more fiber-rich foods. Lifestyle treatments may include regular exercise. If these diet and lifestyle recommendations do not help, your health care provider may recommend taking over-the-counter laxative medicines to help you have bowel movements. Prescription medicines may be prescribed if over-the-counter medicines do not work.  HOME CARE INSTRUCTIONS   Eat foods that have a lot of fiber, such as fruits, vegetables, whole grains, and beans.  Limit foods high in fat and processed sugars, such as french fries, hamburgers, cookies, candies, and soda.   A fiber supplement may be added to your diet if you cannot get enough fiber from foods.   Drink enough fluids to keep your urine clear or pale yellow.   Exercise regularly or as directed by your health care provider.   Go to the restroom when you have the urge to go. Do not hold it.   Only take over-the-counter or prescription medicines as directed by your health care provider. Do not take other medicines for constipation without talking to your health care provider first.  [place] IF:   You have bright red blood in your stool.   Your constipation lasts for more than 4 days or gets worse.   You have abdominal or rectal pain.   You have thin, pencil-like stools.   You have unexplained weight loss. MAKE SURE YOU:   Understand these instructions.  Will watch your condition.  Will get help right away if you are not doing well or get worse.   This information is not intended to replace advice  given to you by your health care provider. Make sure you discuss any questions you have with your health care provider.   Document Released: 06/14/2004 Document Revised: 10/07/2014 Document Reviewed: 06/28/2013 Elsevier Interactive Patient Education [employer].

## 2015-07-20 NOTE — ED Provider Notes (Addendum)
CSN: [id number]     Arrival date & time 07/20/15  1301 History   First MD Initiated Contact with Patient 07/20/15 1317     Chief Complaint  Patient presents with  . Abdominal Pain   (Consider location/radiation/quality/duration/timing/severity/associated sxs/prior Treatment) HPI  A days of abdominal discomfort. Described as a tight and full sensation. Center lower abdominal region. Nonradiating. Fairly constant with waxing and waning nature. Denies any epigastric pain, dysuria, frequency, nausea, vomiting, fevers, back pain. No change with meals. Milk of magnesia with improved bowels which relieves symptoms for short period of time. Patient's doing only every 3-4 days. 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 She had a diverting colostomy for approximately 5 months. Since renal anastomosis of her: She's had no bowel problems.   Past Medical History  Diagnosis Date  . Hypertension   . Diabetes mellitus   . High cholesterol    Past Surgical History  Procedure Laterality Date  . Ectopic pregnancy surgery     Family History  Problem Relation Age of Onset  . Family history unknown: Yes   Social History  Substance Use Topics  . Smoking status: Never Smoker   . Smokeless tobacco: None  . Alcohol Use: No   OB History    No data available     Review of Systems Per HPI with all other pertinent systems negative.   Allergies  Review of patient's allergies indicates no known allergies.  Home Medications   Prior to Admission medications   Medication Sig Start Date End Date Taking? Authorizing Provider  hydrochlorothiazide (MICROZIDE) 12.5 MG capsule Take 12.5 mg by mouth daily.   Yes Historical Provider, MD  diclofenac (CATAFLAM) 50 MG tablet Take 1 tablet (50 mg total) by mouth 3 (three) times daily. 09/05/12   Billy Fischer, MD  metFORMIN (GLUCOPHAGE) 500 MG tablet Take 500 mg by mouth  2 (two) times daily with a meal.    Historical Provider, MD  polyethylene glycol-electrolytes (NULYTELY/GOLYTELY) 420 G solution Take 4,000 mLs by mouth once. 07/20/15   Waldemar Dickens, MD  rosuvastatin (CRESTOR) 10 MG tablet Take 10 mg by mouth daily.    Historical Provider, MD   Meds Ordered and Administered this Visit  Medications - No data to display  BP 141/82 mmHg  Pulse 113  Temp(Src) 99 F (37.2 C) (Oral)  Resp 22  SpO2 100%  LMP 07/19/2015 No data found.   Physical Exam Physical Exam  Constitutional: oriented to person, place, and time. appears well-developed and well-nourished. No distress.  HENT:  Head: Normocephalic and atraumatic.  Eyes: EOMI. PERRL.  Neck: Normal range of motion.  Cardiovascular: RRR, no m/r/g, 2+ distal pulses,  Pulmonary/Chest: Effort normal and breath sounds normal. No respiratory distress.  Abdominal: Obese and difficult to appreciate bowel sounds and/or other pathology. Nontender to palpation.  Musculoskeletal: Normal range of motion. Non ttp, no effusion.  Neurological: alert and oriented to person, place, and time.  Skin: Skin is warm. No rash noted. non diaphoretic.  Psychiatric: normal mood and affect. behavior is normal. Judgment and thought content normal.   ED Course  Procedures (including critical care time)  Labs Review Labs Reviewed - No data to display  Imaging Review No results found.   Visual Acuity Review  Right Eye Distance:   Left Eye Distance:   Bilateral Distance:    Right Eye Near:   Left Eye  Near:    Bilateral Near:         MDM   1. Other constipation     KUB without acute process.  KUB was obtained in part due to patient's complex bowel history including diverting colostomy and reanastomosis. GoLYTELY.  discussed further dietary management and use of medication such as MiraLAX and probiotics in order to prevent this from happening again.   Waldemar Dickens, MD 07/20/15 1343  Waldemar Dickens,  MD 07/20/15 [phone]

## 2015-07-28 ENCOUNTER — Inpatient Hospital Stay (HOSPITAL_COMMUNITY)
Admission: EM | Admit: 2015-07-28 | Discharge: 2015-08-02 | DRG: 872 | Disposition: A | Discharge status: Home / Self Care | Payer: BLUE CROSS/BLUE SHIELD | Attending: Family Medicine | Admitting: Family Medicine

## 2015-07-28 ENCOUNTER — Encounter (HOSPITAL_COMMUNITY): Payer: Self-pay | Admitting: Emergency Medicine

## 2015-07-28 ENCOUNTER — Ambulatory Visit (HOSPITAL_COMMUNITY): Payer: BLUE CROSS/BLUE SHIELD

## 2015-07-28 DIAGNOSIS — K50114 Crohn's disease of large intestine with abscess: Secondary | ICD-10-CM

## 2015-07-28 DIAGNOSIS — K529 Noninfective gastroenteritis and colitis, unspecified: Secondary | ICD-10-CM | POA: Diagnosis present

## 2015-07-28 DIAGNOSIS — Z9049 Acquired absence of other specified parts of digestive tract: Secondary | ICD-10-CM

## 2015-07-28 DIAGNOSIS — A419 Sepsis, unspecified organism: Principal | ICD-10-CM | POA: Diagnosis present

## 2015-07-28 DIAGNOSIS — D649 Anemia, unspecified: Secondary | ICD-10-CM | POA: Insufficient documentation

## 2015-07-28 DIAGNOSIS — D473 Essential (hemorrhagic) thrombocythemia: Secondary | ICD-10-CM | POA: Diagnosis present

## 2015-07-28 DIAGNOSIS — Z6841 Body Mass Index (BMI) 40.0 and over, adult: Secondary | ICD-10-CM

## 2015-07-28 DIAGNOSIS — D509 Iron deficiency anemia, unspecified: Secondary | ICD-10-CM | POA: Diagnosis present

## 2015-07-28 DIAGNOSIS — R1013 Epigastric pain: Secondary | ICD-10-CM | POA: Diagnosis not present

## 2015-07-28 DIAGNOSIS — L309 Dermatitis, unspecified: Secondary | ICD-10-CM | POA: Diagnosis present

## 2015-07-28 DIAGNOSIS — N839 Noninflammatory disorder of ovary, fallopian tube and broad ligament, unspecified: Secondary | ICD-10-CM | POA: Diagnosis not present

## 2015-07-28 DIAGNOSIS — R109 Unspecified abdominal pain: Secondary | ICD-10-CM | POA: Diagnosis present

## 2015-07-28 DIAGNOSIS — D62 Acute posthemorrhagic anemia: Secondary | ICD-10-CM | POA: Diagnosis present

## 2015-07-28 DIAGNOSIS — E7800 Pure hypercholesterolemia, unspecified: Secondary | ICD-10-CM | POA: Diagnosis present

## 2015-07-28 DIAGNOSIS — D75839 Thrombocytosis, unspecified: Secondary | ICD-10-CM | POA: Diagnosis present

## 2015-07-28 DIAGNOSIS — J189 Pneumonia, unspecified organism: Secondary | ICD-10-CM

## 2015-07-28 DIAGNOSIS — E119 Type 2 diabetes mellitus without complications: Secondary | ICD-10-CM | POA: Diagnosis present

## 2015-07-28 DIAGNOSIS — N838 Other noninflammatory disorders of ovary, fallopian tube and broad ligament: Secondary | ICD-10-CM | POA: Diagnosis present

## 2015-07-28 DIAGNOSIS — R100 Acute abdomen: Secondary | ICD-10-CM | POA: Diagnosis not present

## 2015-07-28 DIAGNOSIS — Z79899 Other long term (current) drug therapy: Secondary | ICD-10-CM

## 2015-07-28 DIAGNOSIS — I10 Essential (primary) hypertension: Secondary | ICD-10-CM | POA: Diagnosis present

## 2015-07-28 LAB — URINE MICROSCOPIC-ADD ON

## 2015-07-28 LAB — COMPREHENSIVE METABOLIC PANEL
ALT: 44 U/L (ref 14–54)
ANION GAP: 12 (ref 5–15)
AST: 54 U/L — AB (ref 15–41)
Albumin: 2.4 g/dL — ABNORMAL LOW (ref 3.5–5.0)
Alkaline Phosphatase: 83 U/L (ref 38–126)
BUN: 9 mg/dL (ref 6–20)
CHLORIDE: 100 mmol/L — AB (ref 101–111)
CO2: 23 mmol/L (ref 22–32)
Calcium: 8.6 mg/dL — ABNORMAL LOW (ref 8.9–10.3)
Creatinine, Ser: 0.97 mg/dL (ref 0.44–1.00)
GFR calc Af Amer: >60 mL/min (ref >60)
Glucose, Bld: 105 mg/dL — ABNORMAL HIGH (ref 65–99)
POTASSIUM: 3.7 mmol/L (ref 3.5–5.1)
Sodium: 135 mmol/L (ref 135–145)
Total Bilirubin: 0.7 mg/dL (ref 0.3–1.2)
Total Protein: 8.4 g/dL — ABNORMAL HIGH (ref 6.5–8.1)

## 2015-07-28 LAB — URINALYSIS, ROUTINE W REFLEX MICROSCOPIC
Bilirubin Urine: NEGATIVE
Glucose, UA: NEGATIVE mg/dL
Ketones, ur: NEGATIVE mg/dL
LEUKOCYTES UA: NEGATIVE
Nitrite: NEGATIVE
PROTEIN: NEGATIVE mg/dL
Specific Gravity, Urine: 1.029 (ref 1.005–1.030)
UROBILINOGEN UA: 1 mg/dL (ref 0.0–1.0)
pH: 6 (ref 5.0–8.0)

## 2015-07-28 LAB — CBC WITH DIFFERENTIAL/PLATELET
BASOS ABS: 0 10*3/uL (ref 0.0–0.1)
Basophils Relative: 0 %
EOS PCT: 0 %
Eosinophils Absolute: 0 10*3/uL (ref 0.0–0.7)
HCT: 21.8 % — ABNORMAL LOW (ref 36.0–46.0)
Hemoglobin: 6.4 g/dL — CL (ref 12.0–15.0)
Lymphocytes Relative: 10 %
Lymphs Abs: 1.2 10*3/uL (ref 0.7–4.0)
MCH: 19.6 pg — ABNORMAL LOW (ref 26.0–34.0)
MCHC: 29.4 g/dL — ABNORMAL LOW (ref 30.0–36.0)
MCV: 66.9 fL — AB (ref 78.0–100.0)
MONO ABS: 1.2 10*3/uL — AB (ref 0.1–1.0)
MONOS PCT: 10 %
NEUTROS PCT: 80 %
Neutro Abs: 9.8 10*3/uL — ABNORMAL HIGH (ref 1.7–7.7)
PLATELETS: 888 10*3/uL — AB (ref 150–400)
RBC: 3.26 MIL/uL — AB (ref 3.87–5.11)
RDW: 17.8 % — AB (ref 11.5–15.5)
WBC: 12.2 10*3/uL — AB (ref 4.0–10.5)

## 2015-07-28 LAB — PROCALCITONIN: PROCALCITONIN: 0.27 ng/mL

## 2015-07-28 LAB — PREPARE RBC (CROSSMATCH)

## 2015-07-28 LAB — PROTIME-INR
INR: 1.36 (ref 0.00–1.49)
PROTHROMBIN TIME: 16.9 s — AB (ref 11.6–15.2)

## 2015-07-28 LAB — LIPASE, BLOOD: LIPASE: 27 U/L (ref 11–51)

## 2015-07-28 LAB — HEMOGLOBIN AND HEMATOCRIT, BLOOD
HEMATOCRIT: 25.1 % — AB (ref 36.0–46.0)
Hemoglobin: 7.7 g/dL — ABNORMAL LOW (ref 12.0–15.0)

## 2015-07-28 LAB — POC OCCULT BLOOD, ED: Fecal Occult Bld: NEGATIVE

## 2015-07-28 LAB — ABO/RH: ABO/RH(D): A POS

## 2015-07-28 LAB — I-STAT CG4 LACTIC ACID, ED: LACTIC ACID, VENOUS: 1.44 mmol/L (ref 0.5–2.0)

## 2015-07-28 LAB — I-STAT BETA HCG BLOOD, ED (MC, WL, AP ONLY)

## 2015-07-28 LAB — LACTIC ACID, PLASMA: LACTIC ACID, VENOUS: 1 mmol/L (ref 0.5–2.0)

## 2015-07-28 MED ORDER — PIPERACILLIN-TAZOBACTAM 3.375 G IVPB
3.3750 g | Freq: Once | INTRAVENOUS | Status: AC
  Administered 2015-07-28: 3.375 g via INTRAVENOUS
  Filled 2015-07-28: qty 50

## 2015-07-28 MED ORDER — ONDANSETRON HCL 4 MG PO TABS: 4.0000 mg | ORAL_TABLET | Freq: Four times a day (QID) | ORAL | Status: DC | PRN

## 2015-07-28 MED ORDER — IOHEXOL 300 MG/ML  SOLN
100.0000 mL | Freq: Once | INTRAMUSCULAR | Status: AC | PRN
  Administered 2015-07-28: 100 mL via INTRAVENOUS

## 2015-07-28 MED ORDER — SODIUM CHLORIDE 0.9 % IV BOLUS (SEPSIS)
1000.0000 mL | Freq: Once | INTRAVENOUS | Status: AC
  Administered 2015-07-28: 1000 mL via INTRAVENOUS

## 2015-07-28 MED ORDER — GUAIFENESIN-DM 100-10 MG/5ML PO SYRP
5.0000 mL | ORAL_SOLUTION | ORAL | Status: DC | PRN
  Administered 2015-07-28 (×2): 5 mL via ORAL
  Filled 2015-07-28 (×2): qty 10

## 2015-07-28 MED ORDER — SODIUM CHLORIDE 0.9 % IV SOLN
INTRAVENOUS | Status: DC
  Administered 2015-07-28: 1000 mL via INTRAVENOUS

## 2015-07-28 MED ORDER — PIPERACILLIN-TAZOBACTAM 3.375 G IVPB
3.3750 g | Freq: Three times a day (TID) | INTRAVENOUS | Status: DC
  Administered 2015-07-29 – 2015-07-31 (×7): 3.375 g via INTRAVENOUS
  Filled 2015-07-28 (×11): qty 50

## 2015-07-28 MED ORDER — IOHEXOL 300 MG/ML  SOLN
50.0000 mL | Freq: Once | INTRAMUSCULAR | Status: AC | PRN
  Administered 2015-07-28: 50 mL via ORAL

## 2015-07-28 MED ORDER — ACETAMINOPHEN 325 MG PO TABS
650.0000 mg | ORAL_TABLET | Freq: Once | ORAL | Status: AC
  Administered 2015-07-28: 650 mg via ORAL
  Filled 2015-07-28: qty 2

## 2015-07-28 MED ORDER — SODIUM CHLORIDE 0.9 % IV SOLN
Freq: Once | INTRAVENOUS | Status: AC
  Administered 2015-07-28: 12:00:00 via INTRAVENOUS

## 2015-07-28 MED ORDER — ONDANSETRON HCL 4 MG/2ML IJ SOLN: 4.0000 mg | Freq: Four times a day (QID) | INTRAMUSCULAR | Status: DC | PRN

## 2015-07-28 MED ORDER — HYDROMORPHONE HCL 1 MG/ML IJ SOLN: 1.0000 mg | INTRAMUSCULAR | Status: DC | PRN

## 2015-07-28 MED ORDER — ONDANSETRON HCL 4 MG/2ML IJ SOLN
4.0000 mg | Freq: Once | INTRAMUSCULAR | Status: AC
  Administered 2015-07-28: 4 mg via INTRAVENOUS
  Filled 2015-07-28: qty 2

## 2015-07-28 MED ORDER — HYDROMORPHONE HCL 1 MG/ML IJ SOLN
1.0000 mg | Freq: Once | INTRAMUSCULAR | Status: AC
  Administered 2015-07-28: 1 mg via INTRAVENOUS
  Filled 2015-07-28: qty 1

## 2015-07-28 NOTE — ED Notes (Signed)
Pt reports lower midline abd pain for the past 2-3 weeks. Went to UC and was given prescription for laxatives. Pt having BMs, but it is not relieving the pain. Has been throwing up, 2-3x in last 24 hours.

## 2015-07-28 NOTE — ED Notes (Signed)
Pt transported to CT ?

## 2015-07-28 NOTE — ED Notes (Signed)
Hospitalist at bedside 

## 2015-07-28 NOTE — ED Notes (Signed)
Unable to obtain blood draw in right hand

## 2015-07-28 NOTE — ED Notes (Signed)
Renee Oconnor on [address] [place] notified that blood is ready for pick-up in lab as of 11:54; blood to be picked up and given on floor

## 2015-07-28 NOTE — ED Notes (Signed)
Main lab phlebotomy at bedside attempting blood draw.

## 2015-07-28 NOTE — H&P (Cosign Needed)
[facility] History and Physical  AYEISHA LINDENBERGER FXT:[id number] DOB: [birth year] DOA: 07/28/2015  Referring physician: ED physician, Dr. Maryan Rued  PCP: Elyn Peers, MD   Chief Complaint: abd pain   HPI:  Pt is 44 yo female who presented to [facility] ED with main concern of several weeks duration of progressively worsening epigastric pain, intermittent in nature and throbbing/sharp, 7/10 in severity when present, worse with eating and with no specific alleviating factors, occasionally but not consistently radiating to the lower abd quadrants. This has been associated with nausea and occasional non bloody vomiting, subjective fevers, chills, subsequent poor oral intake. Pt denies similar events in the past, no chest pain or shortness of breath, no known weight loss or gain.   In ED, pt noted to be hemodynamically stable, VS notable for T 100.49F, WBC 12.2 K, Hg 6.4, Plt 888. Imaging studies notable for 14.8 x 14.4 x 13.3 cm multi-septated cystic mass, concerning for ovarian neoplasm or malignancy, also noted nflammation involving a portion of the sigmoid colon that runs superior to this mass, 3.1 x 2.3 cm low density which may represent adjacent abscess.   Assessment and Plan: Principal Problem:   Acute abdominal pain with nauase and non bloody vomiting  - likely related to ovarian mass with ? Abscess - ED doctor consulted GYN, recommended outpatient follow up  - we have also consulted surgery for recommendations on ? Abscess - treat with ABX, analgesia as needed for now  Active Problems:   Acute blood loss anemia - likely from ovarian mass - anemia panel requested - transfuse 2 U PRBC - repeat BMP in AM    Ovarian mass - per GYN, needs outpatient follow up     Sepsis ([place]) - secondary to ? Abscess - will also requested UA - continue with Zosyn for now - follow upon urine and blood cultures    Thrombocytosis ([place]) - unclear etiology - monitor     Morbid obesity  - Body  mass index is 41.52 kg/(m^2).  DVT prophylaxis - SCD's  Radiological Exams on Admission: Ct Abdomen Pelvis W Contrast 07/28/2015 14.8 x 14.4 x 13.3 cm multi-septated cystic mass is seen arising from the pelvis which most likely is ovarian in origin, and is highly concerning for for cystic ovarian neoplasm or malignancy. Consultation with gynecological surgery is recommended. Also noted is probable focal colitis involving the proximal sigmoid colon, with adjacent 3.1 cm low density which may represent adjacent abscess.   Code Status: Full Family Communication: Pt and husband at bedside Disposition Plan: Admit for further evaluation    Mart Piggs [facility] [phone]   Review of Systems:  Constitutional:  Negative for diaphoresis.  HENT: Negative for hearing loss, ear pain, nosebleeds, congestion, sore throat, neck pain, tinnitus and ear discharge.   Eyes: Negative for blurred vision, double vision, photophobia, pain, discharge and redness.  Respiratory: Negative for cough, hemoptysis, sputum production, shortness of breath, wheezing and stridor.   Cardiovascular: Negative for chest pain, palpitations, orthopnea, claudication and leg swelling.  Gastrointestinal:  Negative for heartburn, constipation, blood in stool and melena.  Genitourinary: Negative for hematuria and flank pain.  Musculoskeletal: Negative for myalgias, back pain, joint pain and falls.  Skin: Negative for itching and rash.  Neurological: Negative for tingling, tremors, sensory change, speech change, focal weakness, loss of consciousness and headaches.  Endo/Heme/Allergies: Negative for environmental allergies and polydipsia. Does not bruise/bleed easily.  Psychiatric/Behavioral: Negative for suicidal ideas. The patient is not nervous/anxious.  Past Medical History  Diagnosis Date  . Hypertension   . Diabetes mellitus   . High cholesterol     Past Surgical History  Procedure Laterality Date  . Ectopic pregnancy  surgery    . Bowel resection      Social History:  reports that she has never smoked. She does not have any smokeless tobacco history on file. She reports that she does not drink alcohol or use illicit drugs.  No Known Allergies  Pt denies family history of HTN, HLD, DM  Prior to Admission medications   Medication Sig Start Date End Date Taking? Authorizing Provider  hydrochlorothiazide (HYDRODIURIL) 50 MG tablet Take 25 mg by mouth daily.   Yes Historical Provider, MD  polyethylene glycol-electrolytes (NULYTELY/GOLYTELY) 420 G solution Take 4,000 mLs by mouth once. Patient not taking: Reported on 07/28/2015 07/20/15   Waldemar Dickens, MD    Physical Exam: Filed Vitals:   07/28/15 0816 07/28/15 1024 07/28/15 1102  BP: 160/83 155/71 146/68  Pulse: 123 107 105  Temp: 98.9 F (37.2 C)    TempSrc: Oral    Resp: 20 20 20   SpO2: 96% 92% 96%    Physical Exam  Constitutional: Appears well-developed and well-nourished. No distress.  HENT: Normocephalic. External right and left ear normal. Dry MM Eyes: Conjunctivae and EOM are normal. PERRLA, no scleral icterus.  Neck: Normal ROM. Neck supple. No JVD. No tracheal deviation. No thyromegaly.  CVS: Regular rhythm, tachycardic, S1/S2 +, no murmurs, no gallops, no carotid bruit.  Pulmonary: Effort and breath sounds normal, no stridor, rhonchi, wheezes, rales.  Abdominal: Soft. BS +,  no distension, tenderness in epigastric area, no rebound or guarding.  Musculoskeletal: Normal range of motion. No edema and no tenderness.  Lymphadenopathy: No lymphadenopathy noted, cervical, inguinal. Neuro: Alert. Normal reflexes, muscle tone coordination. No cranial nerve deficit. Skin: Skin is warm and dry. No rash noted. Not diaphoretic. No erythema. No pallor.  Psychiatric: Normal mood and affect. Behavior, judgment, thought content normal.   Labs on Admission:  Basic Metabolic Panel:  Recent Labs Lab 07/28/15 0834  NA 135  K 3.7  CL 100*   CO2 23  GLUCOSE 105*  BUN 9  CREATININE 0.97  CALCIUM 8.6*   Liver Function Tests:  Recent Labs Lab 07/28/15 0834  AST 54*  ALT 44  ALKPHOS 83  BILITOT 0.7  PROT 8.4*  ALBUMIN 2.4*    Recent Labs Lab 07/28/15 0834  LIPASE 27   CBC:  Recent Labs Lab 07/28/15 0834  WBC 12.2*  NEUTROABS 9.8*  HGB 6.4*  HCT 21.8*  MCV 66.9*  PLT 888*    EKG: sinus tachy    If 7PM-7AM, please contact night-coverage [email or web address] Password [facility] 07/28/2015, 11:39 AM

## 2015-07-28 NOTE — ED Provider Notes (Signed)
CSN: [id number]     Arrival date & time 07/28/15  0809 History   First MD Initiated Contact with Patient 07/28/15 [phone]     Chief Complaint  Patient presents with  . Abdominal Pain  . Emesis     (Consider location/radiation/quality/duration/timing/severity/associated sxs/prior Treatment) HPI Comments: Patient is a 44 year old female with a history of our resection after an ectopic pregnancy with colostomy and reanastomosis approximately 8 years ago who presents today with 2 weeks of worsening abdominal pain and change in bowel movements. She was seen 8 days ago at urgent care and at that time was diagnosed with constipation. She had a KUB which was within normal limits and she was given GoLYTELY. She has been taking laxatives since that time with bowel movements every day however symptoms are worsening. She is having worsening abdominal pain with 2-3 episodes of emesis in the last 24 hours. She states she is eating practically nothing because it makes the pain worse but was able to hold down some noodles yesterday. No fever, urinary complaints and last menstrual period was this week. She takes HCTZ for hypertension but no other medications. She states in the 8 years since her surgery she has not had any problems until now  Patient is a 44 y.o. female presenting with abdominal pain and vomiting. The history is provided by the patient.  Abdominal Pain Associated symptoms: vomiting   Emesis Associated symptoms: abdominal pain     Past Medical History  Diagnosis Date  . Hypertension   . Diabetes mellitus   . High cholesterol    Past Surgical History  Procedure Laterality Date  . Ectopic pregnancy surgery    . Bowel resection     Family History  Problem Relation Age of Onset  . Family history unknown: Yes   Social History  Substance Use Topics  . Smoking status: Never Smoker   . Smokeless tobacco: None  . Alcohol Use: No   OB History    No data available     Review of Systems   Gastrointestinal: Positive for vomiting and abdominal pain.  All other systems reviewed and are negative.     Allergies  Review of patient's allergies indicates no known allergies.  Home Medications   Prior to Admission medications   Medication Sig Start Date End Date Taking? Authorizing Provider  diclofenac (CATAFLAM) 50 MG tablet Take 1 tablet (50 mg total) by mouth 3 (three) times daily. 09/05/12   Billy Fischer, MD  hydrochlorothiazide (MICROZIDE) 12.5 MG capsule Take 12.5 mg by mouth daily.    Historical Provider, MD  metFORMIN (GLUCOPHAGE) 500 MG tablet Take 500 mg by mouth 2 (two) times daily with a meal.    Historical Provider, MD  polyethylene glycol-electrolytes (NULYTELY/GOLYTELY) 420 G solution Take 4,000 mLs by mouth once. 07/20/15   Waldemar Dickens, MD  rosuvastatin (CRESTOR) 10 MG tablet Take 10 mg by mouth daily.    Historical Provider, MD   BP 160/83 mmHg  Pulse 123  Temp(Src) 98.9 F (37.2 C) (Oral)  Resp 20  SpO2 96%  LMP 07/19/2015 Physical Exam  Constitutional: She is oriented to person, place, and time. She appears well-developed and well-nourished. She appears distressed.  HENT:  Head: Normocephalic and atraumatic.  Eyes: EOM are normal. Pupils are equal, round, and reactive to light.  Cardiovascular: Regular rhythm, normal heart sounds and intact distal pulses.  Tachycardia present.  Exam reveals no friction rub.   No murmur heard. Pulmonary/Chest: Effort normal and breath sounds  normal. She has no wheezes. She has no rales.  Abdominal: Soft. She exhibits no distension. Bowel sounds are decreased. There is tenderness in the suprapubic area and left lower quadrant. There is guarding. There is no rebound and no CVA tenderness.  Musculoskeletal: Normal range of motion. She exhibits no tenderness.  No edema  Neurological: She is alert and oriented to person, place, and time. No cranial nerve deficit.  Skin: Skin is warm and dry. No rash noted.  Multiple  patches of skin darkening and lightening with occasional skin lesion. This is diffuse over her body including her face, neck, torso and extremities  Psychiatric: She has a normal mood and affect. Her behavior is normal.  Nursing note and vitals reviewed.   ED Course  Procedures (including critical care time) Labs Review Labs Reviewed  CBC WITH DIFFERENTIAL/PLATELET - Abnormal; Notable for the following:    WBC 12.2 (*)    RBC 3.26 (*)    Hemoglobin 6.4 (*)    HCT 21.8 (*)    MCV 66.9 (*)    MCH 19.6 (*)    MCHC 29.4 (*)    RDW 17.8 (*)    Platelets 888 (*)    Neutro Abs 9.8 (*)    Monocytes Absolute 1.2 (*)    All other components within normal limits  COMPREHENSIVE METABOLIC PANEL - Abnormal; Notable for the following:    Chloride 100 (*)    Glucose, Bld 105 (*)    Calcium 8.6 (*)    Total Protein 8.4 (*)    Albumin 2.4 (*)    AST 54 (*)    All other components within normal limits  LIPASE, BLOOD  I-STAT CG4 LACTIC ACID, ED  I-STAT BETA HCG BLOOD, ED ([facility], [facility], [facility] ONLY)  POC OCCULT BLOOD, ED  TYPE AND SCREEN  PREPARE RBC (CROSSMATCH)    Imaging Review Ct Abdomen Pelvis W Contrast  07/28/2015  CLINICAL DATA:  Chronic lower abdominal pain. EXAM: CT ABDOMEN AND PELVIS WITH CONTRAST TECHNIQUE: Multidetector CT imaging of the abdomen and pelvis was performed using the standard protocol following bolus administration of intravenous contrast. CONTRAST:  120mL OMNIPAQUE IOHEXOL 300 MG/ML  SOLN COMPARISON:  None. FINDINGS: Visualized lung bases are unremarkable. No significant osseous abnormality is noted. No gallstones are noted. The liver, spleen and pancreas appear normal. Adrenal glands appear normal. Multiple small bilateral renal cysts are noted. No hydronephrosis or renal obstruction is noted. There is a large multi-septated cystic mass arising from the pelvis which measure 14.8 x 14.4 x 13.3 cm. It is difficult to determine exact origin of this tumor, but most likely is ovarian  in etiology. There also appears to be inflammation involving a portion of the sigmoid colon that runs superior to this mass. Adjacent to this inflamed bowel loop, there is noted 3.1 x 2.3 cm low density which may represent adjacent abscess. Urinary bladder is decompressed. Ovaries visualize, but exact contours cannot be ascertained due to previously described mass. There does not appear to be any evidence of bowel obstruction. IMPRESSION: 14.8 x 14.4 x 13.3 cm multi-septated cystic mass is seen arising from the pelvis which most likely is ovarian in origin, and is highly concerning for for cystic ovarian neoplasm or malignancy. Consultation with gynecological surgery is recommended. Also noted is probable focal colitis involving the proximal sigmoid colon, with adjacent 3.1 cm low density which may represent adjacent abscess. Electronically Signed   By: Marijo Conception, M.D.   On: 07/28/2015 10:36  I have personally reviewed and evaluated these images and lab results as part of my medical decision-making.   EKG Interpretation   Date/Time:  Friday July 28 2015 08:36:16 EDT Ventricular Rate:  106 PR Interval:  150 QRS Duration: 89 QT Interval:  339 QTC Calculation: 450 R Axis:   35 Text Interpretation:  Sinus tachycardia Baseline wander in lead(s) V5 No  significant change since last tracing Confirmed by Maryan Rued  MD, Loree Fee  [phone]) on 07/28/2015 8:44:38 AM      MDM   Final diagnoses:  Ovarian mass  Anemia, unspecified anemia type  Colitis, regional, with abscess [facility])    Patient is a 44 year old female with a complicated bowel history of colon resection status post re anastomosis 4 years ago who presents today with 2 weeks of worsening abdominal pain and new onset vomiting. Patient looks uncomfortable on exam she is tachycardic and has suprapubic and left lower quadrant pain with guarding on exam. She has been having bowel movements but they are small even with laxatives however she  is not eating.  Patient had a KUB done approximately 8 days ago when she was diagnosed with constipation which did not show signs of obstruction however today concern for possible diverticulitis versus problem with the anastomosis site versus obstruction.  Low suspicion that this is GU related. Patient has no urinary complaints and recently finished her menses with some mild spotting now but denies any vaginal discharge.  However possible ovarian pathology.  CBC, CMP, lipase, lactic acid, hCG, CT of the abdomen and pelvis pending for further evaluation  9:45 AM Labs are significant for hemoglobin of 6.4 with normal BUN/creatinine and mild elevation of her AST. Hemoccult was negative however no stool present in the rectal vault. Lactic acid and hCG are within normal limits. She denies any change in her normal menstrual periods and states there no heavier than what they have been. Concerned that she still may be losing blood through her GI tract. Patient consented and blood transfusion ordered. CT pending. Patient's pain is significantly improved after Dilaudid.  11:10 AM CT is significant for a 14 x 14 x 13 cm cystic mass arising from the pelvis most likely ovarian in origin concerning for malignancy. Also possible area of focal colitis with an adjacent 3.1 cm low density concerning for possible abscess.  Will discuss patient with GYN as well as general surgery. Blood transfusion ordered for hemoglobin of 6.  11:20 AM Spoke with Dr. Roland Rack with gyn who said that this is an outpt evaluation and office will call her on Monday for follow up.    CRITICAL CARE Performed by: Blanchie Dessert Total critical care time: 30 minutes Critical care time was exclusive of separately billable procedures and treating other patients. Critical care was necessary to treat or prevent imminent or life-threatening deterioration. Critical care was time spent personally by me on the following activities: development of  treatment plan with patient and/or surrogate as well as nursing, discussions with consultants, evaluation of patient's response to treatment, examination of patient, obtaining history from patient or surrogate, ordering and performing treatments and interventions, ordering and review of laboratory studies, ordering and review of radiographic studies, pulse oximetry and re-evaluation of patient's condition.   Blanchie Dessert, MD 07/28/15 [phone]

## 2015-07-28 NOTE — ED Notes (Signed)
Marcie Bal in mini lab/phelbotomy reports will draw type and screen.

## 2015-07-28 NOTE — Consult Note (Signed)
Reason for Consult:  Possible ovarian mass, focal colitis with possible abscess Referring Physician: Dr. Purcell Mouton  Renee Oconnor is an 44 y.o. female.  HPI: Pt presents with 2 weeks of worsening abdominal pain, a change in bowel habits.  She was seen at an Urgent care and treated with Miralax, after a normal KUB.  She presents with worsening abdominal pain, unable to eat because of the worsening pain.   She says she got some results from the Miralax but continues to have nausea and some occasional vomiting with PO intake.  Pain is in her lower abdomen mid portion more than any other place.   Work up in the ED shows: a low grade fever if 100.5, H/H is 6.4/21.8, WBC 12.2, platelets are 888K.  CT scan sows a 14.8 x 14.4 x 13.3 cm multi-septated cystic mass is seen arising from the pelvis which most likely is ovarian in origin, and is highly concerning for for cystic ovarian neoplasm or malignancy.   There also appears to be inflammation involving a portion of the sigmoid colon that runs superior to this mass. Adjacent to this inflamed bowel loop, there is noted 3.1 x 2.3 cm low density which may represent adjacent abscess.  She is admitted for her anemia and we along with Gyn have been consulted.     Past Medical History  Diagnosis Date  Diabetes mellitus   Hypertension   High cholesterol   Ecotopic pregnancy with subsequent colostomy 2005, and then reversal 2006.   Body mass index is 41.5         Past Surgical History  Procedure Laterality Date  . Right tubal pregnancysurgery    . Bowel resection Exploratory laparotomy with resection and closure of colostomy December 20, 2004. Diagnostic laparoscopy with a partial right salpingectomy, lysis of adhesions, and myomectomy.08/12/2004   a repeat laparoscopy, proctoscopy, and a laparoscopic sigmoid colostomy.07/2004   Dr. Dalbert Batman  Dr. Ena Dawley   Dr. Raphael Gibney and Dalbert Batman     Family History  Problem Relation Age of Onset   . Family history unknown: Yes    Social History:  reports that she has never smoked. She does not have any smokeless tobacco history on file. She reports that she does not drink alcohol or use illicit drugs.  Allergies: No Known Allergies  Prior to Admission medications   Medication Sig Start Date End Date Taking? Authorizing Provider  hydrochlorothiazide (HYDRODIURIL) 50 MG tablet Take 25 mg by mouth daily.   Yes Historical Provider, MD  polyethylene glycol-electrolytes (NULYTELY/GOLYTELY) 420 G solution Take 4,000 mLs by mouth once. Patient not taking: Reported on 07/28/2015 07/20/15   Waldemar Dickens, MD     Results for orders placed or performed during the hospital encounter of 07/28/15 (from the past 48 hour(s))  CBC with Differential/Platelet     Status: Abnormal   Collection Time: 07/28/15  8:34 AM  Result Value Ref Range   WBC 12.2 (H) 4.0 - 10.5 K/uL   RBC 3.26 (L) 3.87 - 5.11 MIL/uL   Hemoglobin 6.4 (LL) 12.0 - 15.0 g/dL    Comment: REPEATED TO VERIFY CRITICAL RESULT CALLED TO, READ BACK BY AND VERIFIED WITH: A NEASE RN AT 0903 ON 07/28/2015 BY S EPPERSON    HCT 21.8 (L) 36.0 - 46.0 %   MCV 66.9 (L) 78.0 - 100.0 fL   MCH 19.6 (L) 26.0 - 34.0 pg   MCHC 29.4 (L) 30.0 - 36.0 g/dL   RDW 17.8 (H) 11.5 - 15.5 %  Platelets 888 (H) 150 - 400 K/uL   Neutrophils Relative % 80 %   Lymphocytes Relative 10 %   Monocytes Relative 10 %   Eosinophils Relative 0 %   Basophils Relative 0 %   Neutro Abs 9.8 (H) 1.7 - 7.7 K/uL   Lymphs Abs 1.2 0.7 - 4.0 K/uL   Monocytes Absolute 1.2 (H) 0.1 - 1.0 K/uL   Eosinophils Absolute 0.0 0.0 - 0.7 K/uL   Basophils Absolute 0.0 0.0 - 0.1 K/uL   RBC Morphology TARGET CELLS   Comprehensive metabolic panel     Status: Abnormal   Collection Time: 07/28/15  8:34 AM  Result Value Ref Range   Sodium 135 135 - 145 mmol/L   Potassium 3.7 3.5 - 5.1 mmol/L   Chloride 100 (L) 101 - 111 mmol/L   CO2 23 22 - 32 mmol/L   Glucose, Bld 105 (H) 65 - 99  mg/dL   BUN 9 6 - 20 mg/dL   Creatinine, Ser 0.97 0.44 - 1.00 mg/dL   Calcium 8.6 (L) 8.9 - 10.3 mg/dL   Total Protein 8.4 (H) 6.5 - 8.1 g/dL   Albumin 2.4 (L) 3.5 - 5.0 g/dL   AST 54 (H) 15 - 41 U/L   ALT 44 14 - 54 U/L   Alkaline Phosphatase 83 38 - 126 U/L   Total Bilirubin 0.7 0.3 - 1.2 mg/dL   GFR calc non Af Amer >60 >60 mL/min   GFR calc Af Amer >60 >60 mL/min    Comment: (NOTE) The eGFR has been calculated using the CKD EPI equation. This calculation has not been validated in all clinical situations. eGFR's persistently <60 mL/min signify possible Chronic Kidney Disease.    Anion gap 12 5 - 15  Lipase, blood     Status: None   Collection Time: 07/28/15  8:34 AM  Result Value Ref Range   Lipase 27 11 - 51 U/L    Comment: Please note change in reference range.  I-Stat Beta hCG blood, ED ([facility], [facility], [facility] only)     Status: None   Collection Time: 07/28/15  8:40 AM  Result Value Ref Range   I-stat hCG, quantitative <5.0 <5 mIU/mL   Comment 3            Comment:   GEST. AGE      CONC.  (mIU/mL)   <=1 WEEK        5 - 50     2 WEEKS       50 - 500     3 WEEKS       100 - 10,000     4 WEEKS     1,000 - 30,000        FEMALE AND NON-PREGNANT FEMALE:     LESS THAN 5 mIU/mL   I-Stat CG4 Lactic Acid, ED     Status: None   Collection Time: 07/28/15  8:42 AM  Result Value Ref Range   Lactic Acid, Venous 1.44 0.5 - 2.0 mmol/L  POC occult blood, ED     Status: None   Collection Time: 07/28/15  9:56 AM  Result Value Ref Range   Fecal Occult Bld NEGATIVE NEGATIVE  Type and screen     Status: None (Preliminary result)   Collection Time: 07/28/15 10:54 AM  Result Value Ref Range   ABO/RH(D) A POS    Antibody Screen PENDING    Sample Expiration 07/31/2015   Prepare RBC     Status: None  Collection Time: 07/28/15 10:54 AM  Result Value Ref Range   Order Confirmation ORDER PROCESSED BY BLOOD BANK   ABO/Rh     Status: None   Collection Time: 07/28/15 10:59 AM  Result Value Ref Range    ABO/RH(D) A POS     Ct Abdomen Pelvis W Contrast  07/28/2015  CLINICAL DATA:  Chronic lower abdominal pain. EXAM: CT ABDOMEN AND PELVIS WITH CONTRAST TECHNIQUE: Multidetector CT imaging of the abdomen and pelvis was performed using the standard protocol following bolus administration of intravenous contrast. CONTRAST:  158m OMNIPAQUE IOHEXOL 300 MG/ML  SOLN COMPARISON:  None. FINDINGS: Visualized lung bases are unremarkable. No significant osseous abnormality is noted. No gallstones are noted. The liver, spleen and pancreas appear normal. Adrenal glands appear normal. Multiple small bilateral renal cysts are noted. No hydronephrosis or renal obstruction is noted. There is a large multi-septated cystic mass arising from the pelvis which measure 14.8 x 14.4 x 13.3 cm. It is difficult to determine exact origin of this tumor, but most likely is ovarian in etiology. There also appears to be inflammation involving a portion of the sigmoid colon that runs superior to this mass. Adjacent to this inflamed bowel loop, there is noted 3.1 x 2.3 cm low density which may represent adjacent abscess. Urinary bladder is decompressed. Ovaries visualize, but exact contours cannot be ascertained due to previously described mass. There does not appear to be any evidence of bowel obstruction. IMPRESSION: 14.8 x 14.4 x 13.3 cm multi-septated cystic mass is seen arising from the pelvis which most likely is ovarian in origin, and is highly concerning for for cystic ovarian neoplasm or malignancy. Consultation with gynecological surgery is recommended. Also noted is probable focal colitis involving the proximal sigmoid colon, with adjacent 3.1 cm low density which may represent adjacent abscess. Electronically Signed   By: JMarijo Conception M.D.   On: 07/28/2015 10:36    Review of Systems  Constitutional: Negative.   HENT: Negative.   Eyes: Negative.   Respiratory: Negative.   Cardiovascular: Negative.   Gastrointestinal:  Positive for nausea, vomiting (more like some wretching, small volumes on an off), abdominal pain and constipation (she has had some results with Miralax given by Urgent Care). Negative for blood in stool.  Genitourinary: Negative.   Musculoskeletal: Negative.   Skin: Negative.   Neurological: Negative.   Endo/Heme/Allergies: Negative.   Psychiatric/Behavioral: Negative.    Blood pressure 146/68, pulse 105, temperature 98.9 F (37.2 C), temperature source Oral, resp. rate 20, last menstrual period 07/19/2015, SpO2 96 %. Physical Exam  Constitutional: She is oriented to person, place, and time. She appears well-developed and well-nourished. No distress.  Body mass index is 41.52 kg/(m^2). In room with no acute distress   HENT:  Head: Normocephalic and atraumatic.  Nose: Nose normal.  Eyes: Conjunctivae and EOM are normal. Right eye exhibits no discharge. Left eye exhibits no discharge. No scleral icterus.  Neck: Normal range of motion. Neck supple. No JVD present. No tracheal deviation present. No thyromegaly present.  Cardiovascular: Normal rate, regular rhythm, normal heart sounds and intact distal pulses.   No murmur heard. Respiratory: Effort normal and breath sounds normal. No respiratory distress. She has no wheezes. She has no rales. She exhibits no tenderness.  GI: Soft. Bowel sounds are normal. She exhibits no distension and no mass. There is tenderness (Below the umbilicus). There is no rebound and no guarding.  Large abdomen, no acute focal tenderness.  Pain is all below the umbilicus,  midline is where it is the worst.  Musculoskeletal: She exhibits no edema or tenderness.  Lymphadenopathy:    She has no cervical adenopathy.  Neurological: She is alert and oriented to person, place, and time. No cranial nerve deficit.  Skin: Skin is warm and dry. No rash noted. She is not diaphoretic. No erythema. No pallor.  Psychiatric: She has a normal mood and affect. Her behavior is  normal. Judgment and thought content normal.    Assessment/Plan: Abdominal pain, nausea and vomiting Large14.8 x 14.4 x 13.3 cm multi-septated cystic mass concerning for ovarian malignancy or neoplasm. Focal colitis proximal sigmoid with 3.1 cm fluid collection AODM Hypertension Hx of ectopic pregnancy; s/p a partial right salpingectomy, lysis of adhesions, and myomectomy.08/12/2004  a repeat laparoscopy, proctoscopy, and a laparoscopic sigmoid colostomy, 08/12/04 Colostomy reversal 2006 Body mass index is 41.5   Plan:  CT reviewed by Dr. Marlou Starks and pt examined by Dr. Marlou Starks.  We recommend she be placed on clears, and treated for the colitis.  Further work up by GYN for possible malignancy. She is being transfused by Medicine, with further work up as recommended by GYN.  I would start her on clears along with IV fluids to hydrate.  Then see how much volume she can take. IV antibiotics to see if this helps with sigmoid colitis while here in the hospitial.  We will follow with you for the colitis.    Yuliya Nova 07/28/2015, 11:31 AM

## 2015-07-28 NOTE — Progress Notes (Signed)
Dr Olen Pel aware that patient had temp of 100.5 ordered to continue with blood transfusion

## 2015-07-28 NOTE — Progress Notes (Signed)
Blood unit #[id number] started transfusing at 1610.  Time charted in error.

## 2015-07-28 NOTE — Progress Notes (Signed)
ANTIBIOTIC CONSULT NOTE - INITIAL  Pharmacy Consult for zosyn Indication: Intra-abdominal Infection  No Known Allergies  Patient Measurements:    Vital Signs: Temp: 100.5 F (38.1 C) (10/28 1242) Temp Source: Oral (10/28 1242) BP: 143/64 mmHg (10/28 1242) Pulse Rate: 108 (10/28 1242) Intake/Output from previous day:   Intake/Output from this shift:    Labs:  Recent Labs  07/28/15 0834  WBC 12.2*  HGB 6.4*  PLT 888*  CREATININE 0.97   CrCl cannot be calculated (Unknown ideal weight.). No results for input(s): VANCOTROUGH, VANCOPEAK, VANCORANDOM, GENTTROUGH, GENTPEAK, GENTRANDOM, TOBRATROUGH, TOBRAPEAK, TOBRARND, AMIKACINPEAK, AMIKACINTROU, AMIKACIN in the last 72 hours.   Microbiology: No results found for this or any previous visit (from the past 720 hour(s)).  Medical History: Past Medical History  Diagnosis Date  . Hypertension   . Diabetes mellitus   . High cholesterol     Assessment: Patient's a 44 y.o F presented to the ED on 10/28 with c/o abd pain for the past few weeks.  Abd CT showed cystic mass and suspicion for focal colitis and abscess.  To start zosyn for suspected intra-abdominal infection.  Plan:  - zosyn 3.375 gm IV q8h (infuse over 4 hours)  Adalea Handler P 07/28/2015,12:47 PM

## 2015-07-28 NOTE — ED Notes (Signed)
Renee Oconnor drew 2 gold tubes to save in lab.

## 2015-07-29 DIAGNOSIS — I10 Essential (primary) hypertension: Secondary | ICD-10-CM | POA: Diagnosis present

## 2015-07-29 DIAGNOSIS — L309 Dermatitis, unspecified: Secondary | ICD-10-CM | POA: Diagnosis present

## 2015-07-29 DIAGNOSIS — K529 Noninfective gastroenteritis and colitis, unspecified: Secondary | ICD-10-CM | POA: Diagnosis present

## 2015-07-29 DIAGNOSIS — R1900 Intra-abdominal and pelvic swelling, mass and lump, unspecified site: Secondary | ICD-10-CM | POA: Diagnosis not present

## 2015-07-29 DIAGNOSIS — Z9049 Acquired absence of other specified parts of digestive tract: Secondary | ICD-10-CM | POA: Diagnosis not present

## 2015-07-29 DIAGNOSIS — A419 Sepsis, unspecified organism: Secondary | ICD-10-CM | POA: Diagnosis present

## 2015-07-29 DIAGNOSIS — R1013 Epigastric pain: Secondary | ICD-10-CM | POA: Diagnosis present

## 2015-07-29 DIAGNOSIS — Z79899 Other long term (current) drug therapy: Secondary | ICD-10-CM | POA: Diagnosis not present

## 2015-07-29 DIAGNOSIS — Z6841 Body Mass Index (BMI) 40.0 and over, adult: Secondary | ICD-10-CM | POA: Diagnosis not present

## 2015-07-29 DIAGNOSIS — E119 Type 2 diabetes mellitus without complications: Secondary | ICD-10-CM | POA: Diagnosis present

## 2015-07-29 DIAGNOSIS — D509 Iron deficiency anemia, unspecified: Secondary | ICD-10-CM | POA: Diagnosis present

## 2015-07-29 DIAGNOSIS — D473 Essential (hemorrhagic) thrombocythemia: Secondary | ICD-10-CM | POA: Diagnosis not present

## 2015-07-29 DIAGNOSIS — R100 Acute abdomen: Secondary | ICD-10-CM | POA: Diagnosis not present

## 2015-07-29 DIAGNOSIS — N839 Noninflammatory disorder of ovary, fallopian tube and broad ligament, unspecified: Secondary | ICD-10-CM | POA: Diagnosis not present

## 2015-07-29 DIAGNOSIS — E7800 Pure hypercholesterolemia, unspecified: Secondary | ICD-10-CM | POA: Diagnosis present

## 2015-07-29 DIAGNOSIS — D62 Acute posthemorrhagic anemia: Secondary | ICD-10-CM | POA: Diagnosis present

## 2015-07-29 LAB — CBC
HEMATOCRIT: 24.9 % — AB (ref 36.0–46.0)
HEMOGLOBIN: 7.7 g/dL — AB (ref 12.0–15.0)
MCH: 21.8 pg — ABNORMAL LOW (ref 26.0–34.0)
MCHC: 30.9 g/dL (ref 30.0–36.0)
MCV: 70.3 fL — ABNORMAL LOW (ref 78.0–100.0)
Platelets: 646 10*3/uL — ABNORMAL HIGH (ref 150–400)
RBC: 3.54 MIL/uL — AB (ref 3.87–5.11)
RDW: 20.2 % — AB (ref 11.5–15.5)
WBC: 11.3 10*3/uL — AB (ref 4.0–10.5)

## 2015-07-29 LAB — IRON AND TIBC
Iron: 10 ug/dL — ABNORMAL LOW (ref 28–170)
SATURATION RATIOS: 4 % — AB (ref 10.4–31.8)
TIBC: 239 ug/dL — AB (ref 250–450)
UIBC: 229 ug/dL

## 2015-07-29 LAB — BASIC METABOLIC PANEL
ANION GAP: 7 (ref 5–15)
BUN: 9 mg/dL (ref 6–20)
CO2: 25 mmol/L (ref 22–32)
Calcium: 8.5 mg/dL — ABNORMAL LOW (ref 8.9–10.3)
Chloride: 103 mmol/L (ref 101–111)
Creatinine, Ser: 1.01 mg/dL — ABNORMAL HIGH (ref 0.44–1.00)
Glucose, Bld: 107 mg/dL — ABNORMAL HIGH (ref 65–99)
POTASSIUM: 3.7 mmol/L (ref 3.5–5.1)
SODIUM: 135 mmol/L (ref 135–145)

## 2015-07-29 LAB — RETICULOCYTES
RBC.: 3.6 MIL/uL — ABNORMAL LOW (ref 3.87–5.11)
RETIC CT PCT: 0.7 % (ref 0.4–3.1)
Retic Count, Absolute: 25.2 10*3/uL (ref 19.0–186.0)

## 2015-07-29 LAB — VITAMIN B12: Vitamin B-12: 335 pg/mL (ref 180–914)

## 2015-07-29 LAB — LACTIC ACID, PLASMA: LACTIC ACID, VENOUS: 1 mmol/L (ref 0.5–2.0)

## 2015-07-29 MED ORDER — ACETAMINOPHEN 325 MG PO TABS
650.0000 mg | ORAL_TABLET | Freq: Four times a day (QID) | ORAL | Status: DC | PRN
  Administered 2015-07-29 – 2015-07-30 (×2): 650 mg via ORAL
  Filled 2015-07-29 (×2): qty 2

## 2015-07-29 NOTE — Progress Notes (Signed)
Notified md regarding pts increased temps.  Also pt requesting throat lozenges for dry cough.

## 2015-07-29 NOTE — Progress Notes (Signed)
[facility] PROGRESS NOTE  Renee Oconnor VOH:[id number] DOB: Nov 01, 1970 DOA: 07/28/2015 PCP: Leonard Downing, MD  HPI/Brief narrative 44 yo female who presented to [facility] ED with main concern of several weeks duration of progressively worsening epigastric pain. In the ED, pt was found to hava a 14.8x14.4x 13.3cm cystic mass worrisome for ovarian cancer as well as inflamed sigmoid colon with 3.1x2.3cm low density worrisome for adjacent abscess. Patient was admitted for further work up.  Assessment/Plan: Principal Problem:  Acute abdominal pain with nauase and non bloody vomiting  - likely related to ovarian mass with sigmoid colitis - treat with ABX per below, analgesia as needed for now  Active Problems:  Suspected anemia of chronic disease, in setting of possible ovarian cancer - iron studies, B12, folate, retic count pending - transfused 2 U PRBC overnight - cont follow CBC and transfuse as needed   Ovarian mass - Records reviewed. Case was discussed with Gyn (Dr. Roland Rack) who has recommended follow up with Gyn on discharge from here - Mass is worrisome for neoplasm    Sepsis [facility]) with sigmoid colitis - CT abd with findings suggestive of sigmoid colitis with possible 3cm abscess - continue with Zosyn for now - General Surgery consulted and is following. Recs to cont IV abx for now and to allow regular diet   Thrombocytosis ([place]) - Suspect reactive thrombocytosis in the setting of colitis - Improved overnight   Morbid obesity  - Body mass index is 41.52 kg/(m^2).  DVT prophylaxis - SCD's  Code Status: Full Family Communication: Pt in room Disposition Plan: Pending   Consultants:  General Surgery  ED Physician (Dr. Maryan Rued) had spoken with Dr. Roland Rack   Antibiotics: Anti-infectives    Start     Dose/Rate Route Frequency Ordered Stop   07/28/15 1800  piperacillin-tazobactam (ZOSYN) IVPB 3.375 g     3.375 g 12.5 mL/hr over 240 Minutes  Intravenous Every 8 hours 07/28/15 1256     07/28/15 1130  piperacillin-tazobactam (ZOSYN) IVPB 3.375 g     3.375 g 12.5 mL/hr over 240 Minutes Intravenous  Once 07/28/15 1128 07/28/15 1538      HPI/Subjective: Reports feeling somewhat better today.  Objective: Filed Vitals:   07/28/15 1857 07/28/15 2219 07/29/15 0547 07/29/15 1352  BP: 152/69 130/72 134/67 133/70  Pulse: 108 101 95 95  Temp: 103.2 F (39.6 C) 100 F (37.8 C) 99.8 F (37.7 C) 101.2 F (38.4 C)  TempSrc: Oral Oral Oral Oral  Resp: 20 18 18 18   Height:      Weight:      SpO2: 100% 98% 100% 95%    Intake/Output Summary (Last 24 hours) at 07/29/15 1451 Last data filed at 07/29/15 0600  Gross per 24 hour  Intake 2072.25 ml  Output   1300 ml  Net 772.25 ml   Filed Weights   07/28/15 1300  Weight: 123.832 kg (273 lb)    Exam:   General:  Awake, in nad  Cardiovascular: regular, s1, s2  Respiratory: normal resp effort, no wheezing  Abdomen: soft, obese, pos BS  Musculoskeletal: perfused, no clubbing   Data Reviewed: Basic Metabolic Panel:  Recent Labs Lab 07/28/15 0834 07/29/15 0035  NA 135 135  K 3.7 3.7  CL 100* 103  CO2 23 25  GLUCOSE 105* 107*  BUN 9 9  CREATININE 0.97 1.01*  CALCIUM 8.6* 8.5*   Liver Function Tests:  Recent Labs Lab 07/28/15 0834  AST 54*  ALT 44  ALKPHOS 83  BILITOT 0.7  PROT 8.4*  ALBUMIN 2.4*    Recent Labs Lab 07/28/15 0834  LIPASE 27   No results for input(s): AMMONIA in the last 168 hours. CBC:  Recent Labs Lab 07/28/15 0834 07/28/15 2105 07/29/15 0035  WBC 12.2*  --  11.3*  NEUTROABS 9.8*  --   --   HGB 6.4* 7.7* 7.7*  HCT 21.8* 25.1* 24.9*  MCV 66.9*  --  70.3*  PLT 888*  --  646*   Cardiac Enzymes: No results for input(s): CKTOTAL, CKMB, CKMBINDEX, TROPONINI in the last 168 hours. BNP (last 3 results) No results for input(s): BNP in the last 8760 hours.  ProBNP (last 3 results) No results for input(s): PROBNP in the last  8760 hours.  CBG: No results for input(s): GLUCAP in the last 168 hours.  Recent Results (from the past 240 hour(s))  Urine culture     Status: None (Preliminary result)   Collection Time: 07/28/15  3:17 PM  Result Value Ref Range Status   Specimen Description URINE, CLEAN CATCH  Final   Special Requests NONE  Final   Culture   Final    CULTURE REINCUBATED FOR BETTER GROWTH Performed at [facility]    Report Status PENDING  Incomplete  Culture, blood (x 2)     Status: None (Preliminary result)   Collection Time: 07/28/15  9:05 PM  Result Value Ref Range Status   Specimen Description BLOOD RIGHT ARM  Final   Special Requests BOTTLES DRAWN AEROBIC AND ANAEROBIC  5CC  Final   Culture   Final    NO GROWTH < 24 HOURS Performed at [facility]    Report Status PENDING  Incomplete  Culture, blood (x 2)     Status: None (Preliminary result)   Collection Time: 07/28/15  9:12 PM  Result Value Ref Range Status   Specimen Description BLOOD RIGHT HAND  Final   Special Requests BOTTLES DRAWN AEROBIC AND ANAEROBIC  8CC  Final   Culture   Final    NO GROWTH < 24 HOURS Performed at [facility]    Report Status PENDING  Incomplete     Studies: Ct Abdomen Pelvis W Contrast  07/28/2015  CLINICAL DATA:  Chronic lower abdominal pain. EXAM: CT ABDOMEN AND PELVIS WITH CONTRAST TECHNIQUE: Multidetector CT imaging of the abdomen and pelvis was performed using the standard protocol following bolus administration of intravenous contrast. CONTRAST:  176mL OMNIPAQUE IOHEXOL 300 MG/ML  SOLN COMPARISON:  None. FINDINGS: Visualized lung bases are unremarkable. No significant osseous abnormality is noted. No gallstones are noted. The liver, spleen and pancreas appear normal. Adrenal glands appear normal. Multiple small bilateral renal cysts are noted. No hydronephrosis or renal obstruction is noted. There is a large multi-septated cystic mass arising from the pelvis which measure 14.8 x  14.4 x 13.3 cm. It is difficult to determine exact origin of this tumor, but most likely is ovarian in etiology. There also appears to be inflammation involving a portion of the sigmoid colon that runs superior to this mass. Adjacent to this inflamed bowel loop, there is noted 3.1 x 2.3 cm low density which may represent adjacent abscess. Urinary bladder is decompressed. Ovaries visualize, but exact contours cannot be ascertained due to previously described mass. There does not appear to be any evidence of bowel obstruction. IMPRESSION: 14.8 x 14.4 x 13.3 cm multi-septated cystic mass is seen arising from the pelvis which most likely is ovarian in origin, and is highly  concerning for for cystic ovarian neoplasm or malignancy. Consultation with gynecological surgery is recommended. Also noted is probable focal colitis involving the proximal sigmoid colon, with adjacent 3.1 cm low density which may represent adjacent abscess. Electronically Signed   By: Marijo Conception, M.D.   On: 07/28/2015 10:36    Scheduled Meds: . piperacillin-tazobactam (ZOSYN)  IV  3.375 g Intravenous Q8H   Continuous Infusions: . sodium chloride 1,000 mL (07/28/15 2011)    Principal Problem:   Acute abdominal pain with nauase and non bloody vomiting  Active Problems:   Acute blood loss anemia   Ovarian mass   Sepsis (HCC)   Thrombocytosis (HCC)   Absolute anemia   Bentlee Ben[place], [place] [facility] Pager [phone]. If 7PM-7AM, please contact night-coverage at [email or web address], password [facility] 07/29/2015, 2:51 PM

## 2015-07-29 NOTE — Progress Notes (Signed)
General Surgery Note   POD -     Assessment/Plan: 1.  Abdominal pain, nausea and vomiting 2.  Large14.8 x 14.4 x 13.3 cm multi-septated cystic mass concerning for ovarian malignancy or neoplasm.  Will need Gyn eval 3.  Focal colitis proximal sigmoid with 3.1 cm fluid collection  On Zosyn  On reg diet   Somewhat better  4.  AODM 5.  Hypertension 6.  Hx of ectopic pregnancy; s/p a partial right salpingectomy, lysis of adhesions, and myomectomy.08/12/2004  a repeat laparoscopy, proctoscopy, and a laparoscopic sigmoid colostomy, 08/12/04 7.  Colostomy reversal 2006 8.  Body mass index is 41.5 9.  Anemia - Hgb - 7.7 - 07/29/2015  She's had 2 units of blood   10.  Eczema - covers much of her skin  11.  DVT prophylaxis - hold chemoprohylaxis because of anemia  Principal Problem:   Acute abdominal pain with nauase and non bloody vomiting  Active Problems:   Acute blood loss anemia   Ovarian mass   Sepsis (HCC)   Thrombocytosis (HCC)   Absolute anemia   Subjective:  Somewhat better.  She feels a tightness across her lower abdomen.  She works at [employer] Objective:   [place]:   07/29/15 0547  BP: 134/67  Pulse: 95  Temp: 99.8 F (37.7 C)  Resp: 18     Intake/Output from previous day:  10/28 0701 - 10/29 0700 In: 2122.3 [P.O.:600; I.V.:836.3; Blood:586; IV Piggyback:100] Out: 1300 [Urine:1300]  Intake/Output this shift:      Physical Exam:   General: Obese AA who is alert and oriented.    HEENT: Normal. Pupils equal. .   Lungs: Clear   Abdomen: Obese.  No localized tenderness.   Old lower midline incision.  Has diffuse eczema.   Lab Results:    Recent Labs  07/28/15 0834 07/28/15 2105 07/29/15 0035  WBC 12.2*  --  11.3*  HGB 6.4* 7.7* 7.7*  HCT 21.8* 25.1* 24.9*  PLT 888*  --  646*    BMET   Recent Labs  07/28/15 0834 07/29/15 0035  NA 135 135  K 3.7 3.7  CL 100* 103  CO2 23 25  GLUCOSE 105* 107*  BUN 9 9  CREATININE 0.97 1.01*   CALCIUM 8.6* 8.5*    PT/INR   Recent Labs  07/28/15 2105  LABPROT 16.9*  INR 1.36    ABG  No results for input(s): PHART, HCO3 in the last 72 hours.  Invalid input(s): PCO2, PO2   Studies/Results:  Ct Abdomen Pelvis W Contrast  07/28/2015  CLINICAL DATA:  Chronic lower abdominal pain. EXAM: CT ABDOMEN AND PELVIS WITH CONTRAST TECHNIQUE: Multidetector CT imaging of the abdomen and pelvis was performed using the standard protocol following bolus administration of intravenous contrast. CONTRAST:  130mL OMNIPAQUE IOHEXOL 300 MG/ML  SOLN COMPARISON:  None. FINDINGS: Visualized lung bases are unremarkable. No significant osseous abnormality is noted. No gallstones are noted. The liver, spleen and pancreas appear normal. Adrenal glands appear normal. Multiple small bilateral renal cysts are noted. No hydronephrosis or renal obstruction is noted. There is a large multi-septated cystic mass arising from the pelvis which measure 14.8 x 14.4 x 13.3 cm. It is difficult to determine exact origin of this tumor, but most likely is ovarian in etiology. There also appears to be inflammation involving a portion of the sigmoid colon that runs superior to this mass. Adjacent to this inflamed bowel loop, there is noted 3.1 x 2.3 cm low density which  may represent adjacent abscess. Urinary bladder is decompressed. Ovaries visualize, but exact contours cannot be ascertained due to previously described mass. There does not appear to be any evidence of bowel obstruction. IMPRESSION: 14.8 x 14.4 x 13.3 cm multi-septated cystic mass is seen arising from the pelvis which most likely is ovarian in origin, and is highly concerning for for cystic ovarian neoplasm or malignancy. Consultation with gynecological surgery is recommended. Also noted is probable focal colitis involving the proximal sigmoid colon, with adjacent 3.1 cm low density which may represent adjacent abscess. Electronically Signed   By: Marijo Conception, M.D.    On: 07/28/2015 10:36     Anti-infectives:   Anti-infectives    Start     Dose/Rate Route Frequency Ordered Stop   07/28/15 1800  piperacillin-tazobactam (ZOSYN) IVPB 3.375 g     3.375 g 12.5 mL/hr over 240 Minutes Intravenous Every 8 hours 07/28/15 1256     07/28/15 1130  piperacillin-tazobactam (ZOSYN) IVPB 3.375 g     3.375 g 12.5 mL/hr over 240 Minutes Intravenous  Once 07/28/15 1128 07/28/15 1538      Alphonsa Overall, MD, FACS Pager: [place] [facility] Office: [phone] 07/29/2015

## 2015-07-30 LAB — CBC
HCT: 27.9 % — ABNORMAL LOW (ref 36.0–46.0)
Hemoglobin: 8.3 g/dL — ABNORMAL LOW (ref 12.0–15.0)
MCH: 21.2 pg — ABNORMAL LOW (ref 26.0–34.0)
MCHC: 29.7 g/dL — ABNORMAL LOW (ref 30.0–36.0)
MCV: 71.4 fL — ABNORMAL LOW (ref 78.0–100.0)
PLATELETS: 655 10*3/uL — AB (ref 150–400)
RBC: 3.91 MIL/uL (ref 3.87–5.11)
RDW: 21.1 % — AB (ref 11.5–15.5)
WBC: 9.7 10*3/uL (ref 4.0–10.5)

## 2015-07-30 LAB — URINE CULTURE

## 2015-07-30 MED ORDER — BENZONATATE 100 MG PO CAPS
100.0000 mg | ORAL_CAPSULE | Freq: Three times a day (TID) | ORAL | Status: DC | PRN
  Administered 2015-07-30: 100 mg via ORAL
  Filled 2015-07-30: qty 1

## 2015-07-30 MED ORDER — FERROUS SULFATE 325 (65 FE) MG PO TABS
325.0000 mg | ORAL_TABLET | Freq: Every day | ORAL | Status: DC
  Administered 2015-07-30 – 2015-08-02 (×4): 325 mg via ORAL
  Filled 2015-07-30 (×5): qty 1

## 2015-07-30 NOTE — Progress Notes (Signed)
Subjective: Fever overnight but she feels well, no abd pain, tol diet, no nausea passing flatus but no bm yet  Objective: Vital signs in last 24 hours: Temp:  [98.2 F (36.8 C)-102 F (38.9 C)] 99.8 F (37.7 C) (10/30 0556) Pulse Rate:  [84-99] 84 (10/30 0556) Resp:  [18] 18 (10/30 0556) BP: (117-133)/(62-70) 125/62 mmHg (10/30 0556) SpO2:  [95 %-100 %] 100 % (10/30 0556) Last BM Date: 07/29/15  Intake/Output from previous day: 10/29 0701 - 10/30 0700 In: 240 [P.O.:240] Out: -  Intake/Output this shift:    GI: soft nt/nd bs present  Lab Results:   Recent Labs  07/28/15 0834 07/28/15 2105 07/29/15 0035  WBC 12.2*  --  11.3*  HGB 6.4* 7.7* 7.7*  HCT 21.8* 25.1* 24.9*  PLT 888*  --  646*   BMET  Recent Labs  07/28/15 0834 07/29/15 0035  NA 135 135  K 3.7 3.7  CL 100* 103  CO2 23 25  GLUCOSE 105* 107*  BUN 9 9  CREATININE 0.97 1.01*  CALCIUM 8.6* 8.5*   PT/INR  Recent Labs  07/28/15 2105  LABPROT 16.9*  INR 1.36   ABG No results for input(s): PHART, HCO3 in the last 72 hours.  Invalid input(s): PCO2, PO2  Studies/Results: Ct Abdomen Pelvis W Contrast  07/28/2015  CLINICAL DATA:  Chronic lower abdominal pain. EXAM: CT ABDOMEN AND PELVIS WITH CONTRAST TECHNIQUE: Multidetector CT imaging of the abdomen and pelvis was performed using the standard protocol following bolus administration of intravenous contrast. CONTRAST:  140mL OMNIPAQUE IOHEXOL 300 MG/ML  SOLN COMPARISON:  None. FINDINGS: Visualized lung bases are unremarkable. No significant osseous abnormality is noted. No gallstones are noted. The liver, spleen and pancreas appear normal. Adrenal glands appear normal. Multiple small bilateral renal cysts are noted. No hydronephrosis or renal obstruction is noted. There is a large multi-septated cystic mass arising from the pelvis which measure 14.8 x 14.4 x 13.3 cm. It is difficult to determine exact origin of this tumor, but most likely is ovarian in  etiology. There also appears to be inflammation involving a portion of the sigmoid colon that runs superior to this mass. Adjacent to this inflamed bowel loop, there is noted 3.1 x 2.3 cm low density which may represent adjacent abscess. Urinary bladder is decompressed. Ovaries visualize, but exact contours cannot be ascertained due to previously described mass. There does not appear to be any evidence of bowel obstruction. IMPRESSION: 14.8 x 14.4 x 13.3 cm multi-septated cystic mass is seen arising from the pelvis which most likely is ovarian in origin, and is highly concerning for for cystic ovarian neoplasm or malignancy. Consultation with gynecological surgery is recommended. Also noted is probable focal colitis involving the proximal sigmoid colon, with adjacent 3.1 cm low density which may represent adjacent abscess. Electronically Signed   By: Marijo Conception, M.D.   On: 07/28/2015 10:36    Anti-infectives: Anti-infectives    Start     Dose/Rate Route Frequency Ordered Stop   07/28/15 1800  piperacillin-tazobactam (ZOSYN) IVPB 3.375 g     3.375 g 12.5 mL/hr over 240 Minutes Intravenous Every 8 hours 07/28/15 1256     07/28/15 1130  piperacillin-tazobactam (ZOSYN) IVPB 3.375 g     3.375 g 12.5 mL/hr over 240 Minutes Intravenous  Once 07/28/15 1128 07/28/15 1538      Assessment/Plan: Ovarian mass Colitis  Not sure if this is colitis or diverticulitis or related to mass Had fever overnight but exam is benign.  Will check cbc today If going up or continues to have fevers may need repeat ct scan No reason from surgery standpoint not to do pharm dvt proph as was written in note yesterday    [facility] 07/30/2015

## 2015-07-30 NOTE — Progress Notes (Signed)
[facility] PROGRESS NOTE  Renee Oconnor MBW:[id number] DOB: 11/13/1970 DOA: 07/28/2015 PCP: Leonard Downing, MD  HPI/Brief narrative 44 yo female who presented to [facility] ED with main concern of several weeks duration of progressively worsening epigastric pain. In the ED, pt was found to hava a 14.8x14.4x 13.3cm cystic mass worrisome for ovarian cancer as well as inflamed sigmoid colon with 3.1x2.3cm low density worrisome for adjacent abscess. Patient was admitted for further work up.  Assessment/Plan: Principal Problem:  Acute abdominal pain with nauase and non bloody vomiting  - likely related to ovarian mass with sigmoid colitis - Cont treat with ABX per below, analgesia as needed for now  Active Problems:  Iron deficiency anemia - iron studies, B12, folate, retic count, revealing of iron deficiency aniemia - transfused 2 U PRBC this admit - cont follow CBC and transfuse as needed - Pt reports continued small vaginal bleeding, suspect related to below ovarian mass - Will start iron supplimentation   Ovarian mass - Records reviewed. Case was discussed with Gyn (Dr. Roland Rack) who has recommended follow up with Gyn on discharge from here - Mass is worrisome for neoplasm  - Pt does report continued small vaginal bleeding   Sepsis ([place]) with sigmoid colitis - CT abd with findings suggestive of sigmoid colitis with possible 3cm abscess - continue with Zosyn for now - General Surgery consulted and is following. Recs to cont IV abx for now. No indication for surgery, however if continued with fevers, consideration for repeat imaging   Thrombocytosis (HCC) - Suspect reactive thrombocytosis in the setting of colitis   Morbid obesity  - Body mass index is 41.52 kg/(m^2).  DVT prophylaxis - SCD's while inpatient  Code Status: Full Family Communication: Pt in room Disposition Plan: Pending   Consultants:  General Surgery  ED Physician (Dr. Maryan Rued) had  spoken with Dr. Roland Rack   Antibiotics: Anti-infectives    Start     Dose/Rate Route Frequency Ordered Stop   07/28/15 1800  piperacillin-tazobactam (ZOSYN) IVPB 3.375 g     3.375 g 12.5 mL/hr over 240 Minutes Intravenous Every 8 hours 07/28/15 1256     07/28/15 1130  piperacillin-tazobactam (ZOSYN) IVPB 3.375 g     3.375 g 12.5 mL/hr over 240 Minutes Intravenous  Once 07/28/15 1128 07/28/15 1538      HPI/Subjective: Eager to follow up with GYN  Objective: Filed Vitals:   07/29/15 2110 07/30/15 0556 07/30/15 1418 07/30/15 1457  BP: 117/70 125/62 130/75   Pulse: 91 84 96   Temp: 98.2 F (36.8 C) 99.8 F (37.7 C)  102.1 F (38.9 C)  TempSrc: Oral Oral  Oral  Resp: 18 18 18    Height:      Weight:      SpO2: 99% 100% 100%     Intake/Output Summary (Last 24 hours) at 07/30/15 1534 Last data filed at 07/30/15 1000  Gross per 24 hour  Intake   2850 ml  Output    800 ml  Net   2050 ml   Filed Weights   07/28/15 1300  Weight: 123.832 kg (273 lb)    Exam:   General:  Awake, laying in bed, in nad  Cardiovascular: regular, s1, s2  Respiratory: normal resp effort, no wheezing  Abdomen: soft, obese, pos BS  Musculoskeletal: perfused, no clubbing, no cyanosis  Data Reviewed: Basic Metabolic Panel:  Recent Labs Lab 07/28/15 0834 07/29/15 0035  NA 135 135  K 3.7 3.7  CL 100* 103  CO2 23  25  GLUCOSE 105* 107*  BUN 9 9  CREATININE 0.97 1.01*  CALCIUM 8.6* 8.5*   Liver Function Tests:  Recent Labs Lab 07/28/15 0834  AST 54*  ALT 44  ALKPHOS 83  BILITOT 0.7  PROT 8.4*  ALBUMIN 2.4*    Recent Labs Lab 07/28/15 0834  LIPASE 27   No results for input(s): AMMONIA in the last 168 hours. CBC:  Recent Labs Lab 07/28/15 0834 07/28/15 2105 07/29/15 0035 07/30/15 0959  WBC 12.2*  --  11.3* 9.7  NEUTROABS 9.8*  --   --   --   HGB 6.4* 7.7* 7.7* 8.3*  HCT 21.8* 25.1* 24.9* 27.9*  MCV 66.9*  --  70.3* 71.4*  PLT 888*  --  646* 655*   Cardiac  Enzymes: No results for input(s): CKTOTAL, CKMB, CKMBINDEX, TROPONINI in the last 168 hours. BNP (last 3 results) No results for input(s): BNP in the last 8760 hours.  ProBNP (last 3 results) No results for input(s): PROBNP in the last 8760 hours.  CBG: No results for input(s): GLUCAP in the last 168 hours.  Recent Results (from the past 240 hour(s))  Urine culture     Status: None   Collection Time: 07/28/15  3:17 PM  Result Value Ref Range Status   Specimen Description URINE, CLEAN CATCH  Final   Special Requests NONE  Final   Culture   Final    MULTIPLE SPECIES PRESENT, SUGGEST RECOLLECTION Performed at [facility]    Report Status 07/30/2015 FINAL  Final  Culture, blood (x 2)     Status: None (Preliminary result)   Collection Time: 07/28/15  9:05 PM  Result Value Ref Range Status   Specimen Description BLOOD RIGHT ARM  Final   Special Requests BOTTLES DRAWN AEROBIC AND ANAEROBIC  5CC  Final   Culture   Final    NO GROWTH 2 DAYS Performed at [facility]    Report Status PENDING  Incomplete  Culture, blood (x 2)     Status: None (Preliminary result)   Collection Time: 07/28/15  9:12 PM  Result Value Ref Range Status   Specimen Description BLOOD RIGHT HAND  Final   Special Requests BOTTLES DRAWN AEROBIC AND ANAEROBIC  8CC  Final   Culture   Final    NO GROWTH 2 DAYS Performed at [facility]    Report Status PENDING  Incomplete     Studies: No results found.  Scheduled Meds: . ferrous sulfate  325 mg Oral Q breakfast  . piperacillin-tazobactam (ZOSYN)  IV  3.375 g Intravenous Q8H   Continuous Infusions: . sodium chloride 1,000 mL (07/28/15 2011)    Principal Problem:   Acute abdominal pain with nauase and non bloody vomiting  Active Problems:   Acute blood loss anemia   Ovarian mass   Sepsis (HCC)   Thrombocytosis (HCC)   Absolute anemia   Sreenidhi Ganson, [place] [facility] Pager [phone]. If 7PM-7AM, please contact  night-coverage at [email or web address], password [facility] 07/30/2015, 3:34 PM  LOS: 1 day

## 2015-07-31 ENCOUNTER — Encounter (HOSPITAL_COMMUNITY): Payer: Self-pay | Admitting: Obstetrics & Gynecology

## 2015-07-31 DIAGNOSIS — R1900 Intra-abdominal and pelvic swelling, mass and lump, unspecified site: Secondary | ICD-10-CM

## 2015-07-31 LAB — TYPE AND SCREEN
ABO/RH(D): A POS
ANTIBODY SCREEN: NEGATIVE
UNIT DIVISION: 0
UNIT DIVISION: 0

## 2015-07-31 LAB — CBC
HEMATOCRIT: 24.4 % — AB (ref 36.0–46.0)
Hemoglobin: 7.4 g/dL — ABNORMAL LOW (ref 12.0–15.0)
MCH: 21.4 pg — AB (ref 26.0–34.0)
MCHC: 30.3 g/dL (ref 30.0–36.0)
MCV: 70.5 fL — ABNORMAL LOW (ref 78.0–100.0)
Platelets: 611 10*3/uL — ABNORMAL HIGH (ref 150–400)
RBC: 3.46 MIL/uL — ABNORMAL LOW (ref 3.87–5.11)
RDW: 21.5 % — ABNORMAL HIGH (ref 11.5–15.5)
WBC: 11 10*3/uL — AB (ref 4.0–10.5)

## 2015-07-31 LAB — BASIC METABOLIC PANEL
Anion gap: 7 (ref 5–15)
BUN: 9 mg/dL (ref 6–20)
CHLORIDE: 105 mmol/L (ref 101–111)
CO2: 25 mmol/L (ref 22–32)
CREATININE: 0.8 mg/dL (ref 0.44–1.00)
Calcium: 8.8 mg/dL — ABNORMAL LOW (ref 8.9–10.3)
GFR calc non Af Amer: >60 mL/min (ref >60)
Glucose, Bld: 101 mg/dL — ABNORMAL HIGH (ref 65–99)
POTASSIUM: 3.7 mmol/L (ref 3.5–5.1)
Sodium: 137 mmol/L (ref 135–145)

## 2015-07-31 LAB — FOLATE RBC
FOLATE, HEMOLYSATE: 324 ng/mL
FOLATE, RBC: 1328 ng/mL (ref >498)
Hematocrit: 24.4 % — ABNORMAL LOW (ref 34.0–46.6)

## 2015-07-31 MED ORDER — AMOXICILLIN-POT CLAVULANATE 875-125 MG PO TABS
1.0000 | ORAL_TABLET | Freq: Two times a day (BID) | ORAL | Status: DC
  Administered 2015-07-31 – 2015-08-02 (×5): 1 via ORAL
  Filled 2015-07-31 (×6): qty 1

## 2015-07-31 NOTE — Progress Notes (Signed)
  Subjective: She feels better, no pain this AM.  Her IV is out too.    Objective: Vital signs in last 24 hours: Temp:  [98.5 F (36.9 C)-102.1 F (38.9 C)] 99.9 F (37.7 C) (10/31 0551) Pulse Rate:  [96-99] 98 (10/31 0551) Resp:  [18] 18 (10/31 0551) BP: (130-162)/(72-85) 147/75 mmHg (10/31 0551) SpO2:  [98 %-100 %] 98 % (10/31 0551) Last BM Date: 07/30/15 600 PO recorded yesterday 1500 urine recorded Diet:  Regular Temp up to 102 x 1 yesterday, then down,  VSS WBC 11.0, H/H also dropping  Transfused on Admit CT scan 07/28/15 Intake/Output from previous day: 10/30 0701 - 10/31 0700 In: 3891.3 [P.O.:600; I.V.:3041.3; IV Piggyback:250] Out: 1500 [Urine:1500] Intake/Output this shift: Total I/O In: 240 [P.O.:240] Out: 300 [Urine:300]  General appearance: alert, cooperative and no distress GI: soft, non-tender; bowel sounds normal; no masses,  no organomegaly  Lab Results:   Recent Labs  07/30/15 0959 07/31/15 0540  WBC 9.7 11.0*  HGB 8.3* 7.4*  HCT 27.9* 24.4*  PLT 655* 611*    BMET  Recent Labs  07/29/15 0035 07/31/15 0540  NA 135 137  K 3.7 3.7  CL 103 105  CO2 25 25  GLUCOSE 107* 101*  BUN 9 9  CREATININE 1.01* 0.80  CALCIUM 8.5* 8.8*   PT/INR  Recent Labs  07/28/15 2105  LABPROT 16.9*  INR 1.36     Recent Labs Lab 07/28/15 0834  AST 54*  ALT 44  ALKPHOS 83  BILITOT 0.7  PROT 8.4*  ALBUMIN 2.4*     Lipase     Component Value Date/Time   LIPASE 27 07/28/2015 0834     Studies/Results: No results found.  Medications: . ferrous sulfate  325 mg Oral Q breakfast  . piperacillin-tazobactam (ZOSYN)  IV  3.375 g Intravenous Q8H    Assessment/Plan 1. Abdominal pain, nausea and vomiting 2. Large14.8 x 14.4 x 13.3 cm multi-septated cystic mass concerning for ovarian malignancy or neoplasm 3.  Focal colitis proximal sigmoid with 3.1 cm fluid collection 4. AODM 5. Hypertension 6. Hx of ectopic pregnancy; s/p a partial  right salpingectomy, lysis of adhesions, and myomectomy.08/12/2004  a repeat laparoscopy, proctoscopy, and a laparoscopic sigmoid colostomy, 08/12/04 7. Colostomy reversal 2006 8. Body mass index is 41.5 9. Anemia - Hgb - 7.7 - 07/29/2015  She's had 2 units of blood 10. Eczema - covers much of her skin 11. DVT prophylaxis - hold chemoprohylaxis because of anemia/SCD 12.  Antibiotics:  Day 4 Zosyn  Plan:  I think she is better, she has no IV, WBC up some but not to much.  GYN oncology coming to see today.  I think we should consider changing her to PO abx.  She is also dropping H/H again.  I don't know if she is bleeding into this cystic mass or is it's dilutional.  She certainly does not have pain associated bleeding.  Will discuss with DR. Ingram.       LOS: 2 days    Renee Oconnor 07/31/2015

## 2015-07-31 NOTE — Progress Notes (Signed)
[facility] PROGRESS NOTE  AMSI GRIMLEY ATF:[id number] DOB: 04/05/1971 DOA: 07/28/2015 PCP: Leonard Downing, MD  HPI/Brief narrative 44 yo female who presented to [facility] ED with main concern of several weeks duration of progressively worsening epigastric pain. In the ED, pt was found to hava a 14.8x14.4x 13.3cm cystic mass worrisome for ovarian cancer as well as inflamed sigmoid colon with 3.1x2.3cm low density worrisome for adjacent abscess. Patient was admitted for further work up.  Assessment/Plan: Principal Problem:  Acute abdominal pain with nauase and non bloody vomiting  - likely related to ovarian mass with sigmoid colitis - Will cont ABX per below with analgesia as needed for now  Active Problems:  Iron deficiency anemia - iron studies, B12, folate, retic count, revealing of iron deficiency aniemia - transfused 2 U PRBC this admit - cont follow CBC and transfuse as needed to maintain hgb>7 - Pt had reported recent small vaginal bleeding, suspect related to below ovarian mass - Started iron supplimentation   Ovarian mass - Records reviewed. Case was earlier discussed with Gyn (Dr. Roland Rack) who had recommended follow up with Gyn on discharge from here - Mass is worrisome for neoplasm  - Per above, pt did report continued small vaginal bleeding - Have consulted [place] who has seen patient and is planning for surgery towards the end of 11/16 after acute colitis has resolved   Sepsis [facility]) with sigmoid colitis - General Surgery following - CT abd with findings suggestive of sigmoid colitis with possible 3cm abscess - Patient was continued on Zosyn initially.  - Recs to transition to PO abx today - Tmax of 102 noted at 3pm on 10/30   Thrombocytosis (HCC) - Suspect reactive thrombocytosis in the setting of acute colitis - Improving   Morbid obesity  - Body mass index is 41.52 kg/(m^2).  DVT prophylaxis - SCD's while inpatient  Code Status:  Full Family Communication: Pt in room Disposition Plan: Pending   Consultants:  General Surgery  ED Physician (Dr. Maryan Rued) had spoken with Dr. Nelda Bucks Onc - Dr. Denman George   Antibiotics: Anti-infectives    Start     Dose/Rate Route Frequency Ordered Stop   07/31/15 1300  amoxicillin-clavulanate (AUGMENTIN) 875-125 MG per tablet 1 tablet     1 tablet Oral Every 12 hours 07/31/15 1158     07/28/15 1800  piperacillin-tazobactam (ZOSYN) IVPB 3.375 g  Status:  Discontinued     3.375 g 12.5 mL/hr over 240 Minutes Intravenous Every 8 hours 07/28/15 1256 07/31/15 1158   07/28/15 1130  piperacillin-tazobactam (ZOSYN) IVPB 3.375 g     3.375 g 12.5 mL/hr over 240 Minutes Intravenous  Once 07/28/15 1128 07/28/15 1538      HPI/Subjective: Reports abd fullness. No nausea. Is passing flatus  Objective: Filed Vitals:   07/30/15 2127 07/30/15 2230 07/31/15 0551 07/31/15 1400  BP: 162/85 140/72 147/75 147/77  Pulse: 99  98 105  Temp: 98.5 F (36.9 C)  99.9 F (37.7 C) 99.1 F (37.3 C)  TempSrc: Oral  Oral Oral  Resp: 18  18 18   Height:      Weight:      SpO2: 100%  98% 100%    Intake/Output Summary (Last 24 hours) at 07/31/15 1555 Last data filed at 07/31/15 1400  Gross per 24 hour  Intake 1461.25 ml  Output   1200 ml  Net 261.25 ml   Filed Weights   07/28/15 1300  Weight: 123.832 kg (273 lb)    Exam:  General:  Awake, sitting in bed, in nad  Cardiovascular: regular, s1, s2  Respiratory: normal resp effort, no wheezing  Abdomen: soft, obese, pos BS, mildly distended  Musculoskeletal: perfused, no clubbing, no cyanosis  Data Reviewed: Basic Metabolic Panel:  Recent Labs Lab 07/28/15 0834 07/29/15 0035 07/31/15 0540  NA 135 135 137  K 3.7 3.7 3.7  CL 100* 103 105  CO2 23 25 25   GLUCOSE 105* 107* 101*  BUN 9 9 9   CREATININE 0.97 1.01* 0.80  CALCIUM 8.6* 8.5* 8.8*   Liver Function Tests:  Recent Labs Lab 07/28/15 0834  AST 54*  ALT 44   ALKPHOS 83  BILITOT 0.7  PROT 8.4*  ALBUMIN 2.4*    Recent Labs Lab 07/28/15 0834  LIPASE 27   No results for input(s): AMMONIA in the last 168 hours. CBC:  Recent Labs Lab 07/28/15 0834 07/28/15 2105 07/29/15 0035 07/29/15 1508 07/30/15 0959 07/31/15 0540  WBC 12.2*  --  11.3*  --  9.7 11.0*  NEUTROABS 9.8*  --   --   --   --   --   HGB 6.4* 7.7* 7.7*  --  8.3* 7.4*  HCT 21.8* 25.1* 24.9* 24.4* 27.9* 24.4*  MCV 66.9*  --  70.3*  --  71.4* 70.5*  PLT 888*  --  646*  --  655* 611*   Cardiac Enzymes: No results for input(s): CKTOTAL, CKMB, CKMBINDEX, TROPONINI in the last 168 hours. BNP (last 3 results) No results for input(s): BNP in the last 8760 hours.  ProBNP (last 3 results) No results for input(s): PROBNP in the last 8760 hours.  CBG: No results for input(s): GLUCAP in the last 168 hours.  Recent Results (from the past 240 hour(s))  Urine culture     Status: None   Collection Time: 07/28/15  3:17 PM  Result Value Ref Range Status   Specimen Description URINE, CLEAN CATCH  Final   Special Requests NONE  Final   Culture   Final    MULTIPLE SPECIES PRESENT, SUGGEST RECOLLECTION Performed at [facility]    Report Status 07/30/2015 FINAL  Final  Culture, blood (x 2)     Status: None (Preliminary result)   Collection Time: 07/28/15  9:05 PM  Result Value Ref Range Status   Specimen Description BLOOD RIGHT ARM  Final   Special Requests BOTTLES DRAWN AEROBIC AND ANAEROBIC  5CC  Final   Culture   Final    NO GROWTH 3 DAYS Performed at [facility]    Report Status PENDING  Incomplete  Culture, blood (x 2)     Status: None (Preliminary result)   Collection Time: 07/28/15  9:12 PM  Result Value Ref Range Status   Specimen Description BLOOD RIGHT HAND  Final   Special Requests BOTTLES DRAWN AEROBIC AND ANAEROBIC  8CC  Final   Culture   Final    NO GROWTH 3 DAYS Performed at [facility]    Report Status PENDING  Incomplete      Studies: No results found.  Scheduled Meds: . amoxicillin-clavulanate  1 tablet Oral Q12H  . ferrous sulfate  325 mg Oral Q breakfast   Continuous Infusions: . sodium chloride 1,000 mL (07/28/15 2011)    Principal Problem:   Acute abdominal pain with nauase and non bloody vomiting  Active Problems:   Acute blood loss anemia   Ovarian mass   Sepsis (HCC)   Thrombocytosis (HCC)   Absolute anemia   Suraj Ramdass,  [place] [facility] Pager [phone]. If 7PM-7AM, please contact night-coverage at [email or web address], password [facility] 07/31/2015, 3:55 PM  LOS: 2 days

## 2015-07-31 NOTE — Consult Note (Signed)
Consult Note: Gyn-Onc  Consult was requested by Dr. Earlie Counts for the evaluation of ROLLANDE THURSBY 44 y.o. female  With a 15cm pelvic/abdominal mass  CC:  Chief Complaint  Patient presents with  . Abdominal Pain  . Emesis    Assessment/Plan:  Ms. DEANNE BEDGOOD  is a 44 y.o.  year old with a 15cm complex, mostly cystic abdominal mass that is likely ovarian in origin in the setting of colitis.  She is improving from her infectious process.  I have personally reviewed her CT images. I have a low suspicion for this representing a malignancy. However, due to its size, I recommend surgical removal. This can and should be performed in an elective manner to optimize the patient preoperatively. I discussed that I do not recommend oophorectomy in close proximity to her infectious morbidity as this increases her risk for perioperative infectious complications or of GI involvement/adhesions requiring GI resection and possibly colostomy.  Instead I recommend continuing medical therapy for her colitis. I will see her again as an outpatient (our office will establish this visit for her) and re-evaluate her abdominal exam and clinical picture.   We will plan for surgery in late November if she is clinically improved.  Surgery would involve ex lap, unilateral salpingo-oophorectomy, possible hysterectomy, possible staging pending frozen section results.  I discussed operative risks including  bleeding, infection, damage to internal organs (such as bladder,ureters, bowels), blood clot, reoperation and rehospitalization. I discussed that she is at an increased risk for all of these due to her morbid obesity.  I suspect that she is diabetic given her elevated blood glucoses during this admission. I recommend evaluation by her PCP for optimization of this preoperatively. I discussed with the patient that uncontrolled diabetes is associated with very high risks for perioperative morbidity, particularly wound  infection and separation.  HPI: Earnestine Shipp is a 44 year old woman who is seen in consultation at the request of Dr Wyline Copas for a large ovarian mass.  The patient was aditted on 07/28/15 for presumed colitis due to CT findings, fevers, and GI symptoms.  CT of the abdomen and pelvis on 07/28/15 showed : There is a large multi-septated cystic mass arising from the pelvis which measure 14.8 x 14.4 x 13.3 cm. It is difficult to determine exact origin of this tumor, but most likely is ovarian in etiology. There also appears to be inflammation involving a portion of the sigmoid colon that runs superior to this mass. Adjacent to this inflamed bowel loop, there is noted 3.1 x 2.3 cm low density which may represent adjacent abscess..  The patient reported symptoms prior to admission of 2 weeks of epigastric and mid abdominal pain, intermittent nausea and emesis and intermittent obstipation.  She has a remote hx in 2006 of an ex lap, myomectomy and colonic resection with reanastamosis with Dr Raphael Gibney. This was reversed 6 months later.   She has no family hx concerning from breast/ovarian cancer syndrome.  Current Meds:  No current facility-administered medications on file prior to encounter.   Current Outpatient Prescriptions on File Prior to Encounter  Medication Sig Dispense Refill  . polyethylene glycol-electrolytes (NULYTELY/GOLYTELY) 420 G solution Take 4,000 mLs by mouth once. (Patient not taking: Reported on 07/28/2015) 4000 mL 0  . [DISCONTINUED] amLODipine-valsartan (EXFORGE) 10-160 MG per tablet Take 1 tablet by mouth daily.       Allergy: No Known Allergies  Social Hx:   Social History   Social History  . Marital Status:  Married    Spouse Name: N/A  . Number of Children: N/A  . Years of Education: N/A   Occupational History  . Not on file.   Social History Main Topics  . Smoking status: Never Smoker   . Smokeless tobacco: Never Used  . Alcohol Use: No  . Drug Use: No  .  Sexual Activity: Not Currently   Other Topics Concern  . Not on file   Social History Narrative    Past Surgical Hx:  Past Surgical History  Procedure Laterality Date  . Ectopic pregnancy surgery    . Bowel resection      Past Medical Hx:  Past Medical History  Diagnosis Date  . Hypertension   . Diabetes mellitus   . High cholesterol     Past Gynecological History:  premenopausal Patient's last menstrual period was 07/19/2015.  Family Hx:  Family History  Problem Relation Age of Onset  . Family history unknown: Yes    Review of Systems:  Constitutional  Feels well,    ENT Normal appearing ears and nares bilaterally Skin/Breast  No rash, sores, jaundice, itching, dryness Cardiovascular  No chest pain, shortness of breath, or edema  Pulmonary  No cough or wheeze.  Gastro Intestinal  See HPI Genito Urinary  No frequency, urgency, dysuria,  Musculo Skeletal  No myalgia, arthralgia, joint swelling or pain  Neurologic  No weakness, numbness, change in gait,  Psychology  No depression, anxiety, insomnia.   Vitals:  Blood pressure 147/77, pulse 105, temperature 99.1 F (37.3 C), temperature source Oral, resp. rate 18, height 5\' 8"  (1.727 m), weight 273 lb (123.832 kg), last menstrual period 07/19/2015, SpO2 100 %.  Physical Exam: WD in NAD Neck  Supple NROM, without any enlargements.  Lymph Node Survey No cervical supraclavicular or inguinal adenopathy Skin  No rash/lesions/breakdown  Psychiatry  Alert and oriented to person, place, and time  Abdomen  Normoactive bowel sounds, abdomen soft, non-tender and obese without evidence of hernia. Unable to appreciate mass due to body habitus. Back No CVA tenderness Genito Urinary  Deferred due to evaluation of patient in hospital bed. Rectal  deferred Extremities  No bilateral cyanosis, clubbing or edema.   Donaciano Eva, MD  07/31/2015, 3:15 PM

## 2015-08-01 ENCOUNTER — Inpatient Hospital Stay (HOSPITAL_COMMUNITY): Payer: BLUE CROSS/BLUE SHIELD

## 2015-08-01 LAB — CBC WITH DIFFERENTIAL/PLATELET
BASOS ABS: 0 10*3/uL (ref 0.0–0.1)
BASOS PCT: 0 %
EOS ABS: 0.2 10*3/uL (ref 0.0–0.7)
Eosinophils Relative: 2 %
HEMATOCRIT: 24.4 % — AB (ref 36.0–46.0)
HEMOGLOBIN: 7.2 g/dL — AB (ref 12.0–15.0)
LYMPHS PCT: 12 %
Lymphs Abs: 1.2 10*3/uL (ref 0.7–4.0)
MCH: 21.2 pg — AB (ref 26.0–34.0)
MCHC: 29.5 g/dL — AB (ref 30.0–36.0)
MCV: 72 fL — ABNORMAL LOW (ref 78.0–100.0)
MONOS PCT: 6 %
Monocytes Absolute: 0.6 10*3/uL (ref 0.1–1.0)
NEUTROS ABS: 8.3 10*3/uL — AB (ref 1.7–7.7)
NEUTROS PCT: 80 %
Platelets: 579 10*3/uL — ABNORMAL HIGH (ref 150–400)
RBC: 3.39 MIL/uL — ABNORMAL LOW (ref 3.87–5.11)
RDW: 22.3 % — ABNORMAL HIGH (ref 11.5–15.5)
WBC: 10.3 10*3/uL (ref 4.0–10.5)

## 2015-08-01 MED ORDER — FERROUS SULFATE 325 (65 FE) MG PO TABS: 325.0000 mg | ORAL_TABLET | Freq: Every day | ORAL | Status: DC

## 2015-08-01 MED ORDER — KETOROLAC TROMETHAMINE 10 MG PO TABS: 10.0000 mg | ORAL_TABLET | Freq: Four times a day (QID) | ORAL | Status: DC | PRN

## 2015-08-01 MED ORDER — POLYETHYLENE GLYCOL 3350 17 G PO PACK: 17.0000 g | PACK | Freq: Every day | ORAL | Status: AC

## 2015-08-01 MED ORDER — AMOXICILLIN-POT CLAVULANATE 875-125 MG PO TABS: 1.0000 | ORAL_TABLET | Freq: Two times a day (BID) | ORAL | Status: DC

## 2015-08-01 MED ORDER — ONDANSETRON HCL 4 MG PO TABS: 4.0000 mg | ORAL_TABLET | Freq: Three times a day (TID) | ORAL | Status: DC | PRN

## 2015-08-01 NOTE — Discharge Instructions (Signed)
Do not drive, operate heavy machinery, or handle small children when taking sedating medications such as pain pills

## 2015-08-01 NOTE — Progress Notes (Signed)
  Subjective: She feels better she has something midline lower abdomen, but when i palpate her abdomen, what she experiences right now is the need to void.  No real pain.  She is tolerating a regular diet.  She is having loose stools, but she has not had a normal BM for about 2 weeks.  She seem fairly comfortable, NO IS in the room, and I wonder if some of her intermittent fevers are from some other source.  She had a negative urine culture on admit.  Objective: Vital signs in last 24 hours: Temp:  [98.5 F (36.9 C)-100.4 F (38 C)] 100.4 F (38 C) (11/01 0610) Pulse Rate:  [96-105] 96 (11/01 0610) Resp:  [18] 18 (11/01 0610) BP: (142-159)/(72-79) 142/72 mmHg (11/01 0610) SpO2:  [97 %-100 %] 97 % (11/01 0610) Last BM Date: 07/30/15 1200 PO 1400 urine Tm 100.4 at 0300 this AM WBC stabe, H/H is also low  Intake/Output from previous day: 10/31 0701 - 11/01 0700 In: 1200 [P.O.:1200] Out: 1400 [Urine:1400] Intake/Output this shift:    General appearance: alert, cooperative and no distress Resp: clear to auscultation bilaterally GI: soft, non-tender; bowel sounds normal; no masses,  no organomegaly  Lab Results:   Recent Labs  07/31/15 0540 08/01/15 0528  WBC 11.0* 10.3  HGB 7.4* 7.2*  HCT 24.4* 24.4*  PLT 611* 579*    BMET  Recent Labs  07/31/15 0540  NA 137  K 3.7  CL 105  CO2 25  GLUCOSE 101*  BUN 9  CREATININE 0.80  CALCIUM 8.8*   PT/INR No results for input(s): LABPROT, INR in the last 72 hours.   Recent Labs Lab 07/28/15 0834  AST 54*  ALT 44  ALKPHOS 83  BILITOT 0.7  PROT 8.4*  ALBUMIN 2.4*     Lipase     Component Value Date/Time   LIPASE 27 07/28/2015 0834     Studies/Results: No results found.  Medications: . amoxicillin-clavulanate  1 tablet Oral Q12H  . ferrous sulfate  325 mg Oral Q breakfast    Assessment/Plan 1. Abdominal pain, nausea and vomiting 2. Large14.8 x 14.4 x 13.3 cm multi-septated cystic mass concerning for  ovarian malignancy or neoplasm 3. Focal colitis proximal sigmoid with 3.1 cm fluid collection 4. AODM 5. Hypertension 6. Hx of ectopic pregnancy; s/p a partial right salpingectomy, lysis of adhesions, and myomectomy.08/12/2004  a repeat laparoscopy, proctoscopy, and a laparoscopic sigmoid colostomy, 08/12/04 7. Colostomy reversal 2006 8. Body mass index is 41.5 9. Anemia - Hgb - 7.7 - 07/29/2015 She's had 2 units of blood  Down to 7.2 today 10. Eczema - covers much of her skin 11. DVT prophylaxis - hold chemoprohylaxis because of anemia/SCD 12. Antibiotics: Day 4 Zosyn, Day 2 Augmentin    Plan:  From our standpoint she needs 14 days of antibiotics, she will need follow up with GI and a colonoscopy at some point after discharge, and she can follow up with Dr. Marlou Starks in about 1 month.  She is going to be set up with Dr. Denman George to have this mass removed at some point also so we could work on coordinating follow ups around that procedure.  I will discuss with Dr. Wyline Copas later today also.  LOS: 3 days    Renee Oconnor 08/01/2015

## 2015-08-01 NOTE — Progress Notes (Signed)
[facility] PROGRESS NOTE  Renee Oconnor JJK:[id number] DOB: 01-Nov-1970 DOA: 07/28/2015 PCP: Leonard Downing, MD  HPI/Brief narrative 44 yo female who presented to [facility] ED with main concern of several weeks duration of progressively worsening epigastric pain. In the ED, pt was found to hava a 14.8x14.4x 13.3cm cystic mass worrisome for ovarian cancer as well as inflamed sigmoid colon with 3.1x2.3cm low density worrisome for adjacent abscess. Patient was admitted for further work up.  Assessment/Plan: Principal Problem:  Acute abdominal pain with nauase and non bloody vomiting  - likely related to ovarian mass with sigmoid colitis - Pain improved  Active Problems:  Iron deficiency anemia - iron studies, B12, folate, retic count, suggestive of iron deficiency aniemia - transfused 2 U PRBC during this admit - Cont follow CBC and transfuse as needed to maintain hgb>7 - Pt had reported recent small vaginal bleeding, suspect related to below ovarian mass - Continued on iron supplimentation   Ovarian mass - Records reviewed. Case was initially discussed with Gyn (Dr. Roland Rack) who had recommended follow up with Gyn on discharge from here - Per above, pt did report continued small vaginal bleeding - Had later consulted [place] who has seen patient and is planning for surgery towards the end of this month after acute colitis has resolved   Sepsis [facility]) with sigmoid colitis - General Surgery following - CT abd with findings suggestive of sigmoid colitis with possible 3cm abscess - Patient was continued on Zosyn initially.  - Patient has since transitioned to PO abx 10/31 - Tmax of 100.43F, confirmed with rectal temp this AM - Will continue above abx for now and monitor   Thrombocytosis (HCC) - Suspect reactive thrombocytosis in the setting of acute colitis - Improving   Morbid obesity  - Body mass index is 41.52 kg/(m^2).  DVT prophylaxis - SCD's while  inpatient  Code Status: Full Family Communication: Pt in room Disposition Plan: Possible d/c 11/2 if afebrile overnight   Consultants:  General Surgery  ED Physician (Dr. Maryan Rued) had spoken with Dr. Nelda Bucks Onc - Dr. Denman George   Antibiotics: Anti-infectives    Start     Dose/Rate Route Frequency Ordered Stop   08/01/15 0000  amoxicillin-clavulanate (AUGMENTIN) 875-125 MG tablet     1 tablet Oral Every 12 hours 08/01/15 0833     07/31/15 1300  amoxicillin-clavulanate (AUGMENTIN) 875-125 MG per tablet 1 tablet     1 tablet Oral Every 12 hours 07/31/15 1158     07/28/15 1800  piperacillin-tazobactam (ZOSYN) IVPB 3.375 g  Status:  Discontinued     3.375 g 12.5 mL/hr over 240 Minutes Intravenous Every 8 hours 07/28/15 1256 07/31/15 1158   07/28/15 1130  piperacillin-tazobactam (ZOSYN) IVPB 3.375 g     3.375 g 12.5 mL/hr over 240 Minutes Intravenous  Once 07/28/15 1128 07/28/15 1538      HPI/Subjective: Very eager to go home.  Objective: Filed Vitals:   07/31/15 2139 08/01/15 0610 08/01/15 0910 08/01/15 1400  BP: 159/79 142/72  154/78  Pulse: 99 96  100  Temp: 98.5 F (36.9 C) 100.4 F (38 C) 100.3 F (37.9 C) 98.8 F (37.1 C)  TempSrc: Oral Oral Rectal Oral  Resp: 18 18  18   Height:      Weight:      SpO2: 98% 97%  100%    Intake/Output Summary (Last 24 hours) at 08/01/15 1427 Last data filed at 08/01/15 1400  Gross per 24 hour  Intake   1200  ml  Output   1500 ml  Net   -300 ml   Filed Weights   07/28/15 1300  Weight: 123.832 kg (273 lb)    Exam:   General:  Awake, ambulating in room, in nad  Cardiovascular: regular, s1, s2  Respiratory: normal resp effort, no wheezing  Abdomen: soft, obese, pos BS, mildly distended  Musculoskeletal: perfused, no clubbing, no cyanosis  Data Reviewed: Basic Metabolic Panel:  Recent Labs Lab 07/28/15 0834 07/29/15 0035 07/31/15 0540  NA 135 135 137  K 3.7 3.7 3.7  CL 100* 103 105  CO2 23 25 25    GLUCOSE 105* 107* 101*  BUN 9 9 9   CREATININE 0.97 1.01* 0.80  CALCIUM 8.6* 8.5* 8.8*   Liver Function Tests:  Recent Labs Lab 07/28/15 0834  AST 54*  ALT 44  ALKPHOS 83  BILITOT 0.7  PROT 8.4*  ALBUMIN 2.4*    Recent Labs Lab 07/28/15 0834  LIPASE 27   No results for input(s): AMMONIA in the last 168 hours. CBC:  Recent Labs Lab 07/28/15 0834 07/28/15 2105 07/29/15 0035 07/29/15 1508 07/30/15 0959 07/31/15 0540 08/01/15 0528  WBC 12.2*  --  11.3*  --  9.7 11.0* 10.3  NEUTROABS 9.8*  --   --   --   --   --  8.3*  HGB 6.4* 7.7* 7.7*  --  8.3* 7.4* 7.2*  HCT 21.8* 25.1* 24.9* 24.4* 27.9* 24.4* 24.4*  MCV 66.9*  --  70.3*  --  71.4* 70.5* 72.0*  PLT 888*  --  646*  --  655* 611* 579*   Cardiac Enzymes: No results for input(s): CKTOTAL, CKMB, CKMBINDEX, TROPONINI in the last 168 hours. BNP (last 3 results) No results for input(s): BNP in the last 8760 hours.  ProBNP (last 3 results) No results for input(s): PROBNP in the last 8760 hours.  CBG: No results for input(s): GLUCAP in the last 168 hours.  Recent Results (from the past 240 hour(s))  Urine culture     Status: None   Collection Time: 07/28/15  3:17 PM  Result Value Ref Range Status   Specimen Description URINE, CLEAN CATCH  Final   Special Requests NONE  Final   Culture   Final    MULTIPLE SPECIES PRESENT, SUGGEST RECOLLECTION Performed at [facility]    Report Status 07/30/2015 FINAL  Final  Culture, blood (x 2)     Status: None (Preliminary result)   Collection Time: 07/28/15  9:05 PM  Result Value Ref Range Status   Specimen Description BLOOD RIGHT ARM  Final   Special Requests BOTTLES DRAWN AEROBIC AND ANAEROBIC  5CC  Final   Culture   Final    NO GROWTH 4 DAYS Performed at [facility]    Report Status PENDING  Incomplete  Culture, blood (x 2)     Status: None (Preliminary result)   Collection Time: 07/28/15  9:12 PM  Result Value Ref Range Status   Specimen  Description BLOOD RIGHT HAND  Final   Special Requests BOTTLES DRAWN AEROBIC AND ANAEROBIC  [place]  Final   Culture   Final    NO GROWTH 4 DAYS Performed at [facility]    Report Status PENDING  Incomplete     Studies: Dg Chest Port 1 View  08/01/2015  CLINICAL DATA:  Cough, fever, hypertension EXAM: PORTABLE CHEST 1 VIEW COMPARISON:  None. FINDINGS: Cardiomegaly. Lungs are clear. No effusions or edema. No acute bony abnormality. IMPRESSION: Cardiomegaly.  No active disease. Electronically Signed   By: Rolm Baptise M.D.   On: 08/01/2015 09:31    Scheduled Meds: . amoxicillin-clavulanate  1 tablet Oral Q12H  . ferrous sulfate  325 mg Oral Q breakfast   Continuous Infusions: . sodium chloride 1,000 mL (07/28/15 2011)    Principal Problem:   Acute abdominal pain with nauase and non bloody vomiting  Active Problems:   Acute blood loss anemia   Ovarian mass   Sepsis ([place])   Thrombocytosis ([place])   Absolute anemia   Averleigh Savary, [place] [facility] Pager [phone]. If 7PM-7AM, please contact night-coverage at [email or web address], password [facility] 08/01/2015, 2:27 PM  LOS: 3 days

## 2015-08-02 ENCOUNTER — Encounter (HOSPITAL_COMMUNITY): Payer: Self-pay | Admitting: Student

## 2015-08-02 DIAGNOSIS — R100 Acute abdomen: Secondary | ICD-10-CM

## 2015-08-02 DIAGNOSIS — D473 Essential (hemorrhagic) thrombocythemia: Secondary | ICD-10-CM

## 2015-08-02 DIAGNOSIS — N839 Noninflammatory disorder of ovary, fallopian tube and broad ligament, unspecified: Secondary | ICD-10-CM

## 2015-08-02 DIAGNOSIS — A419 Sepsis, unspecified organism: Principal | ICD-10-CM

## 2015-08-02 LAB — CBC
HCT: 25.3 % — ABNORMAL LOW (ref 36.0–46.0)
Hemoglobin: 7.6 g/dL — ABNORMAL LOW (ref 12.0–15.0)
MCH: 21.5 pg — ABNORMAL LOW (ref 26.0–34.0)
MCHC: 30 g/dL (ref 30.0–36.0)
MCV: 71.5 fL — ABNORMAL LOW (ref 78.0–100.0)
Platelets: 599 10*3/uL — ABNORMAL HIGH (ref 150–400)
RBC: 3.54 MIL/uL — ABNORMAL LOW (ref 3.87–5.11)
RDW: 22.3 % — AB (ref 11.5–15.5)
WBC: 11.1 10*3/uL — AB (ref 4.0–10.5)

## 2015-08-02 LAB — CULTURE, BLOOD (ROUTINE X 2)
Culture: NO GROWTH
Culture: NO GROWTH

## 2015-08-02 MED ORDER — AMOXICILLIN-POT CLAVULANATE 875-125 MG PO TABS: 1.0000 | ORAL_TABLET | Freq: Two times a day (BID) | ORAL | Status: DC

## 2015-08-02 MED ORDER — KETOROLAC TROMETHAMINE 10 MG PO TABS: 10.0000 mg | ORAL_TABLET | Freq: Four times a day (QID) | ORAL | Status: DC | PRN

## 2015-08-02 MED ORDER — FERROUS SULFATE 325 (65 FE) MG PO TABS: 325.0000 mg | ORAL_TABLET | Freq: Two times a day (BID) | ORAL | Status: AC

## 2015-08-02 NOTE — Progress Notes (Signed)
  Subjective: She feels fine, no pain, she says she fill up fast and feels tight after eating large meal.  She had 2 large BM's this AM, dark stool and I told her FE would do that.  Objective: Vital signs in last 24 hours: Temp:  [98.1 F (36.7 C)-100.3 F (37.9 C)] 98.4 F (36.9 C) (11/02 0728) Pulse Rate:  [97-103] 103 (11/02 0728) Resp:  [18-20] 20 (11/02 0728) BP: (154-172)/(78-91) 163/79 mmHg (11/02 0728) SpO2:  [97 %-100 %] 98 % (11/02 0728) Last BM Date: 08/02/15 480 Po recorded Urine 1400 Afebrile, since yesterday, VSS WBC is stable CXR is clear Intake/Output from previous day: 11/01 0701 - 11/02 0700 In: 480 [P.O.:480] Out: 1400 [Urine:1400] Intake/Output this shift: Total I/O In: -  Out: 300 [Urine:300]  General appearance: alert, cooperative and no distress GI: soft, + BS, no pain or tenderness on palpation.  Lab Results:   Recent Labs  08/01/15 0528 08/02/15 0530  WBC 10.3 11.1*  HGB 7.2* 7.6*  HCT 24.4* 25.3*  PLT 579* 599*    BMET  Recent Labs  07/31/15 0540  NA 137  K 3.7  CL 105  CO2 25  GLUCOSE 101*  BUN 9  CREATININE 0.80  CALCIUM 8.8*   PT/INR No results for input(s): LABPROT, INR in the last 72 hours.   Recent Labs Lab 07/28/15 0834  AST 54*  ALT 44  ALKPHOS 83  BILITOT 0.7  PROT 8.4*  ALBUMIN 2.4*     Lipase     Component Value Date/Time   LIPASE 27 07/28/2015 0834     Studies/Results: Dg Chest Port 1 View  08/01/2015  CLINICAL DATA:  Cough, fever, hypertension EXAM: PORTABLE CHEST 1 VIEW COMPARISON:  None. FINDINGS: Cardiomegaly. Lungs are clear. No effusions or edema. No acute bony abnormality. IMPRESSION: Cardiomegaly.  No active disease. Electronically Signed   By: Rolm Baptise M.D.   On: 08/01/2015 09:31    Medications: . amoxicillin-clavulanate  1 tablet Oral Q12H  . ferrous sulfate  325 mg Oral Q breakfast    Assessment/Plan 2. Large14.8 x 14.4 x 13.3 cm multi-septated cystic mass concerning for  ovarian malignancy or neoplasm 3. Focal colitis proximal sigmoid with 3.1 cm fluid collection 4. AODM 5. Hypertension 6. Hx of ectopic pregnancy; s/p a partial right salpingectomy, lysis of adhesions, and myomectomy.08/12/2004  a repeat laparoscopy, proctoscopy, and a laparoscopic sigmoid colostomy, 08/12/04 7. Colostomy reversal 2006 8. Body mass index is 41.5 9. Anemia - Hgb - 7.7 - 07/29/2015 She's had 2 units of blood Down to 7.2 today 10. Eczema - covers much of her skin 11. DVT prophylaxis - hold chemoprohylaxis because of anemia/SCD 12. Antibiotics: Day 4 Zosyn, Day 3 Augmentin   Plan:  From our standpoint she can go home,  She needs a total of 14 days of antibiotic coverage.  Follow up with GI, Dr, Denman George and Dr. Marlou Starks in about 1 month; this follow up with our office is in the AVS.  LOS: 4 days    Renee Oconnor 08/02/2015

## 2015-08-02 NOTE — Discharge Summary (Signed)
Physician Discharge Summary  Renee Oconnor VAP:[id number] DOB: [birth year] DOA: 07/28/2015  PCP: Renee Downing, MD  Admit date: 07/28/2015 Discharge date: 08/02/2015  Time spent:  25 minutes  Recommendations for Outpatient Follow-up:  1. Follow up General surgery in one month 2. Follow up GYN oncology  in 2 weeks  Discharge Diagnoses:  Principal Problem:   Acute abdominal pain with nauase and non bloody vomiting  Active Problems:   Acute blood loss anemia   Ovarian mass   Sepsis (HCC)   Thrombocytosis (HCC)   Absolute anemia   Discharge Condition: Stable  Diet recommendation: Regular diet  Filed Weights   07/28/15 1300  Weight: 123.832 kg (273 lb)    History of present illness:  [name], intermittent in nature and throbbing/sharp, 7/10 in severity when present, worse with eating and with no specific alleviating factors, occasionally but not consistently radiating to the lower abd quadrants. This has been associated with nausea and occasional non bloody vomiting, subjective fevers, chills, subsequent poor oral intake. Pt denies similar events in the past, no chest pain or shortness of breath, no known weight loss or gain.   In ED, pt noted to be hemodynamically stable, VS notable for T 100.69F, WBC 12.2 K, Hg 6.4, Plt 888. Imaging studies notable for 14.8 x 14.4 x 13.3 cm multi-septated cystic mass, concerning for ovarian neoplasm or malignancy, also noted nflammation involving a portion of the sigmoid colon that runs superior to this mass, 3.1 x 2.3 cm low density which may represent adjacent abscess.    Hospital Course:   Acute abdominal pain with nauase and non bloody vomiting  - likely related to ovarian mass with sigmoid colitis - Pain improved  Active Problems:  Iron deficiency anemia - iron studies, B12, folate, retic count, suggestive of  iron deficiency anemia. FOBT is negative - transfused 2 U PRBC during this admit - Cont follow CBC and transfuse as needed to maintain hgb>7 - Pt had reported recent small vaginal bleeding, suspect related to below ovarian mass - On iron supplements, will continue ferrous sulfate 325 mg po BID    Ovarian mass - Records reviewed. Case was initially discussed with Gyn (Renee Oconnor) who had recommended follow up with Gyn on discharge from here - Per above, pt did report continued small vaginal bleeding - Had later consulted [place] who has seen patient and is planning for surgery towards the end of this month after acute colitis has resolved   Sepsis ([place]) with sigmoid colitis - CT abd with findings suggestive of sigmoid colitis with possible 3cm abscess - Patient was continued on Zosyn initially.  - Patient was  transitioned to PO abx 10/31 - patient became febrile yesterday, and discharge was held. - She is now afebrile and will be discharged on Augmentin for 8 more days to complete the 14 days of therapy. - Will follow up with surgery  in one month   Thrombocytosis ([place]) - Suspect reactive thrombocytosis in the setting of acute colitis - Improving, today the platelet count is 599  Procedures:  None  Consultations:  Surgery  GYN oncology  Discharge Exam: Filed Vitals:   08/02/15 0728  BP: 163/79  Pulse: 103  Temp: 98.4 F (36.9 C)  Resp: 20    General: Appears in no acute distress Cardiovascular: S1S2 RRR Respiratory: Clear bilaterally  Discharge Instructions   Discharge Instructions  Diet - low sodium heart healthy    Complete by:  As directed      Increase activity slowly    Complete by:  As directed           Current Discharge Medication List    START taking these medications   Details  amoxicillin-clavulanate (AUGMENTIN) 875-125 MG tablet Take 1 tablet by mouth every 12 (twelve) hours. Qty: 16 tablet, Refills: 0    ferrous sulfate 325 (65  FE) MG tablet Take 1 tablet (325 mg total) by mouth 2 (two) times daily with a meal. Qty: 30 tablet, Refills: 2    ketorolac (TORADOL) 10 MG tablet Take 1 tablet (10 mg total) by mouth every 6 (six) hours as needed. Qty: 20 tablet, Refills: 0    polyethylene glycol (MIRALAX) packet Take 17 g by mouth daily. Qty: 14 each, Refills: 0      CONTINUE these medications which have NOT CHANGED   Details  hydrochlorothiazide (HYDRODIURIL) 50 MG tablet Take 25 mg by mouth daily.      STOP taking these medications     polyethylene glycol-electrolytes (NULYTELY/GOLYTELY) 420 G solution        No Known Allergies Follow-up Information    Follow up with Renee Downing, MD. Schedule an appointment as soon as possible for a visit in 1 week.   Specialty:  Family Medicine   Why:  Hospital follow up   Contact information:   [place] [id number] [phone]       Follow up with Renee Eva, MD.   Specialty:  Obstetrics and Gynecology   Why:  Hospital follow up   Contact information:   [place] [id number] [phone]       Follow up with Renee Roof, MD. Schedule an appointment as soon as possible for a visit in 1 month.   Specialty:  General Surgery   Contact information:   [place]  [id number] [phone]        The results of significant diagnostics from this hospitalization (including imaging, microbiology, ancillary and laboratory) are listed below for reference.    Significant Diagnostic Studies: Dg Abd 1 View  07/20/2015  CLINICAL DATA:  Lower abdominal tightness for 2 days. EXAM: ABDOMEN - 1 VIEW COMPARISON:  12/21/2014 FINDINGS: There is mild gaseous distension of a short segment of rather featureless loop of presumably descending colon within the left lower abdominal quadrant measuring approximately 4.6 cm in diameter. Otherwise, nonobstructive bowel gas pattern. Unremarkable colonic stool  burden. Nondiagnostic evaluation for pneumoperitoneum secondary supine positioning and exclusion of lower thorax. No definite pneumatosis or portal venous gas. No definite abnormal intra-abdominal calcifications. Limited visualization of lower thorax is negative for focal airspace opacity. IMPRESSION: Nonspecific mild gaseous distention of a short segment of rather featureless appearing presumably descending colon. Otherwise, unremarkable abdominal radiographs. Electronically Signed   By: Sandi Mariscal M.D.   On: 07/20/2015 14:14   Ct Abdomen Pelvis W Contrast  07/28/2015  CLINICAL DATA:  Chronic lower abdominal pain. EXAM: CT ABDOMEN AND PELVIS WITH CONTRAST TECHNIQUE: Multidetector CT imaging of the abdomen and pelvis was performed using the standard protocol following bolus administration of intravenous contrast. CONTRAST:  116mL OMNIPAQUE IOHEXOL 300 MG/ML  SOLN COMPARISON:  None. FINDINGS: Visualized lung bases are unremarkable. No significant osseous abnormality is noted. No gallstones are noted. The liver, spleen and pancreas appear normal. Adrenal glands appear normal. Multiple small bilateral renal cysts  are noted. No hydronephrosis or renal obstruction is noted. There is a large multi-septated cystic mass arising from the pelvis which measure 14.8 x 14.4 x 13.3 cm. It is difficult to determine exact origin of this tumor, but most likely is ovarian in etiology. There also appears to be inflammation involving a portion of the sigmoid colon that runs superior to this mass. Adjacent to this inflamed bowel loop, there is noted 3.1 x 2.3 cm low density which may represent adjacent abscess. Urinary bladder is decompressed. Ovaries visualize, but exact contours cannot be ascertained due to previously described mass. There does not appear to be any evidence of bowel obstruction. IMPRESSION: 14.8 x 14.4 x 13.3 cm multi-septated cystic mass is seen arising from the pelvis which most likely is ovarian in origin, and  is highly concerning for for cystic ovarian neoplasm or malignancy. Consultation with gynecological surgery is recommended. Also noted is probable focal colitis involving the proximal sigmoid colon, with adjacent 3.1 cm low density which may represent adjacent abscess. Electronically Signed   By: Marijo Conception, M.D.   On: 07/28/2015 10:36   Dg Chest Port 1 View  08/01/2015  CLINICAL DATA:  Cough, fever, hypertension EXAM: PORTABLE CHEST 1 VIEW COMPARISON:  None. FINDINGS: Cardiomegaly. Lungs are clear. No effusions or edema. No acute bony abnormality. IMPRESSION: Cardiomegaly.  No active disease. Electronically Signed   By: Rolm Baptise M.D.   On: 08/01/2015 09:31    Microbiology: Recent Results (from the past 240 hour(s))  Urine culture     Status: None   Collection Time: 07/28/15  3:17 PM  Result Value Ref Range Status   Specimen Description URINE, CLEAN CATCH  Final   Special Requests NONE  Final   Culture   Final    MULTIPLE SPECIES PRESENT, SUGGEST RECOLLECTION Performed at [facility]    Report Status 07/30/2015 FINAL  Final  Culture, blood (x 2)     Status: None (Preliminary result)   Collection Time: 07/28/15  9:05 PM  Result Value Ref Range Status   Specimen Description BLOOD RIGHT ARM  Final   Special Requests BOTTLES DRAWN AEROBIC AND ANAEROBIC  5CC  Final   Culture   Final    NO GROWTH 4 DAYS Performed at [facility]    Report Status PENDING  Incomplete  Culture, blood (x 2)     Status: None (Preliminary result)   Collection Time: 07/28/15  9:12 PM  Result Value Ref Range Status   Specimen Description BLOOD RIGHT HAND  Final   Special Requests BOTTLES DRAWN AEROBIC AND ANAEROBIC  8CC  Final   Culture   Final    NO GROWTH 4 DAYS Performed at [facility]    Report Status PENDING  Incomplete     Labs: Basic Metabolic Panel:  Recent Labs Lab 07/28/15 0834 07/29/15 0035 07/31/15 0540  NA 135 135 137  K 3.7 3.7 3.7  CL 100* 103 105   CO2 23 25 25   GLUCOSE 105* 107* 101*  BUN 9 9 9   CREATININE 0.97 1.01* 0.80  CALCIUM 8.6* 8.5* 8.8*   Liver Function Tests:  Recent Labs Lab 07/28/15 0834  AST 54*  ALT 44  ALKPHOS 83  BILITOT 0.7  PROT 8.4*  ALBUMIN 2.4*    Recent Labs Lab 07/28/15 0834  LIPASE 27   No results for input(s): AMMONIA in the last 168 hours. CBC:  Recent Labs Lab 07/28/15 3419  07/29/15 0035 07/29/15 1508 07/30/15 3790  07/31/15 0540 08/01/15 0528 08/02/15 0530  WBC 12.2*  --  11.3*  --  9.7 11.0* 10.3 11.1*  NEUTROABS 9.8*  --   --   --   --   --  8.3*  --   HGB 6.4*  < > 7.7*  --  8.3* 7.4* 7.2* 7.6*  HCT 21.8*  < > 24.9* 24.4* 27.9* 24.4* 24.4* 25.3*  MCV 66.9*  --  70.3*  --  71.4* 70.5* 72.0* 71.5*  PLT 888*  --  646*  --  655* 611* 579* 599*  < > = values in this interval not displayed.     SignedEleonore Chiquito S  [facility] 08/02/2015, 10:29 AM

## 2015-08-10 ENCOUNTER — Telehealth: Payer: Self-pay

## 2015-08-10 NOTE — Telephone Encounter (Signed)
Call received form Dr Horald Pollen  [facility] Physician ) [facility] : [phone] , Cell : [phone] with an update that the patient was in to see her today and presented some "swelling" to her LLE . Dr Darron Doom requesting that the patient have a Duplex Scan to R/O DVT tomorrow , either before or after her appointment with Dr Everitt Amber here at [facility]. No orders placed by Dr Darron Doom , updated Joylene John , [facility] .

## 2015-08-11 ENCOUNTER — Encounter: Payer: BLUE CROSS/BLUE SHIELD | Admitting: Obstetrics and Gynecology

## 2015-08-11 ENCOUNTER — Ambulatory Visit (HOSPITAL_COMMUNITY)
Admission: RE | Admit: 2015-08-11 | Discharge: 2015-08-11 | Disposition: A | Discharge status: Home / Self Care | Payer: BLUE CROSS/BLUE SHIELD | Source: Ambulatory Visit | Attending: Gynecologic Oncology | Admitting: Gynecologic Oncology

## 2015-08-11 ENCOUNTER — Encounter: Payer: Self-pay | Admitting: Gynecologic Oncology

## 2015-08-11 ENCOUNTER — Ambulatory Visit (HOSPITAL_BASED_OUTPATIENT_CLINIC_OR_DEPARTMENT_OTHER): Payer: BLUE CROSS/BLUE SHIELD | Admitting: Gynecologic Oncology

## 2015-08-11 VITALS — BP 191/93 | HR 109 | Temp 98.7°F | Resp 20 | Ht 68.0 in | Wt 265.3 lb

## 2015-08-11 DIAGNOSIS — R600 Localized edema: Secondary | ICD-10-CM

## 2015-08-11 DIAGNOSIS — I10 Essential (primary) hypertension: Secondary | ICD-10-CM | POA: Insufficient documentation

## 2015-08-11 DIAGNOSIS — R1900 Intra-abdominal and pelvic swelling, mass and lump, unspecified site: Secondary | ICD-10-CM | POA: Diagnosis not present

## 2015-08-11 DIAGNOSIS — M7989 Other specified soft tissue disorders: Secondary | ICD-10-CM | POA: Insufficient documentation

## 2015-08-11 DIAGNOSIS — E119 Type 2 diabetes mellitus without complications: Secondary | ICD-10-CM | POA: Diagnosis not present

## 2015-08-11 DIAGNOSIS — E7800 Pure hypercholesterolemia, unspecified: Secondary | ICD-10-CM | POA: Diagnosis not present

## 2015-08-11 MED ORDER — DOCUSATE SODIUM 100 MG PO CAPS: 100.0000 mg | ORAL_CAPSULE | Freq: Two times a day (BID) | ORAL | Status: DC

## 2015-08-11 NOTE — Patient Instructions (Signed)
We will schedule you to have a doppler of your lower extremities to rule out a blood clot.  Please call us when you have a date and time for your colonoscopy so that we can schedule you to see Dr. Denman George one week after to discuss surgery.  Please call for any questions or concerns.

## 2015-08-11 NOTE — Telephone Encounter (Signed)
No orders placed by Dr Darron Doom , Joylene John , APNP updated.

## 2015-08-11 NOTE — Progress Notes (Signed)
*  PRELIMINARY RESULTS* Vascular Ultrasound Left lower extremity venous duplex has been completed.  Preliminary findings: No evidence of DVT or baker's cyst.  Called results to [place].   Landry Mellow, RDMS, RVT  08/11/2015, 2:13 PM

## 2015-08-11 NOTE — Progress Notes (Signed)
Followup Note: Gyn-Onc  Consult was initially requested inpatient by Dr. Earlie Counts on 07/31/15 for the evaluation of Renee Oconnor 44 y.o. female With a 15cm pelvic/abdominal mass  CC:  Chief Complaint  Patient presents with  . Pelvic Mass    Follow up   Assessment/Plan:  Ms. Renee Oconnor is a 44 y.o. 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  She is improving from her infectious process.  I have personally reviewed her CT images. I have a low suspicion for this representing a malignancy. However, due to its size, I recommend surgical removal. This can and should be performed in an elective manner to optimize the patient preoperatively. I discussed that I do not recommend oophorectomy in close proximity to her infectious morbidity as this increases her risk for perioperative infectious complications or of GI involvement/adhesions requiring GI resection and possibly colostomy.  She will followup with General Surgery within the month and be considered for colonoscopy. After that point in time we will consider her for surgery (ex lap, TAH, LSO, possible staging).  I discussed operative risks including bleeding, infection, damage to internal organs (such as bladder,ureters, bowels), blood clot, reoperation and rehospitalization. I discussed that she is at an increased risk for all of these due to her morbid obesity.  Her left lower extremity edema was evaluated with a doppler today and is negative for DVT.  Her anemia is being treated by FeSO4.  HPI: Renee Oconnor is a 44 year old woman who is seen in consultation at the request of Dr Wyline Copas for a large ovarian mass.  The patient was aditted on 07/28/15 for presumed colitis due to CT findings, fevers, and GI symptoms.  CT of the abdomen and pelvis on 07/28/15 showed : There is a large multi-septated cystic mass arising from the  pelvis which measure 14.8 x 14.4 x 13.3 cm. It is difficult to determine exact origin of this tumor, but most likely is ovarian in etiology. There also appears to be inflammation involving a portion of the sigmoid colon that runs superior to this mass. Adjacent to this inflamed bowel loop, there is noted 3.1 x 2.3 cm low density which may represent adjacent abscess..  The patient reported symptoms prior to admission of 2 weeks of epigastric and mid abdominal pain, intermittent nausea and emesis and intermittent obstipation.  She has a remote hx in 2006 of an ex lap, myomectomy and colonic resection with reanastamosis with Dr Raphael Gibney. This was reversed 6 months later.   She has no family hx concerning from breast/ovarian cancer syndrome.  Interval Hx: She was treated with IV antibiotics and then discharged when she was clinically improved. She has been taking oral antibiotics and has 3 doses left. She denies fevers or chills or pain. She had a fairly heavy period 2 weeks ago.   Current Outpatient Prescriptions on File Prior to Visit  Medication Sig Dispense Refill  . amoxicillin-clavulanate (AUGMENTIN) 875-125 MG tablet Take 1 tablet by mouth every 12 (twelve) hours. 16 tablet 0  . ferrous sulfate 325 (65 FE) MG tablet Take 1 tablet (325 mg total) by mouth 2 (two) times daily with a meal. 30 tablet 2  . hydrochlorothiazide (HYDRODIURIL) 50 MG tablet Take 25 mg by mouth daily.    [place] ketorolac (TORADOL) 10 MG tablet Take 1 tablet (10 mg total) by mouth every 6 (six) hours as needed. [place]  tablet 0  . polyethylene glycol (MIRALAX) packet Take 17 g by mouth daily. 14 each 0  . [DISCONTINUED] amLODipine-valsartan (EXFORGE) 10-160 MG per tablet Take 1 tablet by mouth daily.     No current facility-administered medications on file prior to visit.   No Known Allergies Past Medical History  Diagnosis Date  . Hypertension   . Diabetes mellitus   . High cholesterol    Past Surgical History  Procedure  Laterality Date  . Ectopic pregnancy surgery    . Bowel resection       Past Gynecological History: premenopausal Patient's last menstrual period was 07/19/2015.  Family Hx:  Family History  Problem Relation Age of Onset  . Family history unknown: Yes   Social History   Social History  . Marital Status: Married    Spouse Name: N/A  . Number of Children: N/A  . Years of Education: N/A   Occupational History  . Not on file.   Social History Main Topics  . Smoking status: Never Smoker   . Smokeless tobacco: Never Used  . Alcohol Use: No  . Drug Use: No  . Sexual Activity: Not Currently   Other Topics Concern  . Not on file   Social History Narrative     Review of Systems:  Constitutional  Feels well,  ENT Normal appearing ears and nares bilaterally Skin/Breast  No rash, sores, jaundice, itching, dryness Cardiovascular  No chest pain, shortness of breath, or edema  Pulmonary  No cough or wheeze.  Gastro Intestinal  See HPI Genito Urinary  No frequency, urgency, dysuria,  Musculo Skeletal  No myalgia, arthralgia, joint swelling or pain  Neurologic  No weakness, numbness, change in gait,  Psychology  No depression, anxiety, insomnia.   Vitals: BP 191/93 mmHg  Pulse 109  Temp(Src) 98.7 F (37.1 C) (Oral)  Resp 20  Ht 5\' 8"  (1.727 m)  Wt 265 lb 4.8 oz (120.339 kg)  BMI 40.35 kg/m2  SpO2 97%  LMP 07/19/2015.  Physical Exam: WD in NAD Neck  Supple NROM, without any enlargements.  Lymph Node Survey No cervical supraclavicular or inguinal adenopathy Skin  No rash/lesions/breakdown  Psychiatry  Alert and oriented to person, place, and time  Abdomen  Normoactive bowel sounds, abdomen soft, non-tender and obese without evidence of hernia. Mass palpable in mid abdomen. Back No CVA tenderness Genito Urinary:   Normal external female genitalia. Vagina: normal. Cervix: normal in appearance and palpation. Uterus:  slightly bulky, mobile. Adnexa: Large, solid and cystic mass arising from posterior to uterus and into lower abdomen. Rectal  Soft, no palpable transmural lesions. Pelvic mass appreciated on rectal exam. Extremities  No bilateral cyanosis, clubbing or edema.

## 2015-08-16 ENCOUNTER — Telehealth: Payer: Self-pay

## 2015-08-16 NOTE — Telephone Encounter (Signed)
Error

## 2015-08-16 NOTE — Telephone Encounter (Signed)
Orders received to contact the patient to ensure she scheduled her colonoscopy with Dr Marlou Starks . Patient contacted , patient states she forgot and will schedule the appointment today . Patient states the understanding of the importance to follow through with the procedure. Patient denies further questions and or concerns , will call with any changes.

## 2015-09-08 ENCOUNTER — Telehealth: Payer: Self-pay | Admitting: *Deleted

## 2015-09-08 NOTE — Telephone Encounter (Signed)
Per Dr. Denman George, patient needs to have colonoscopy prior to surgery. Attempted to reach patient to see if she is established with a gastroenterologist already. Left VM requesting return call from patient.

## 2015-09-11 ENCOUNTER — Telehealth: Payer: Self-pay | Admitting: *Deleted

## 2015-09-11 ENCOUNTER — Ambulatory Visit: Payer: BLUE CROSS/BLUE SHIELD | Attending: Gynecologic Oncology | Admitting: Gynecologic Oncology

## 2015-09-11 NOTE — Telephone Encounter (Signed)
Referral placed to [facility] [phone], appt for pt to have colonoscopy on 09/14/15 1:15pm with Dr. Oletta Lamas. Called notified pt of appt. Gave pt address, phone number of office. Discussed with pt she will see MD for an evaluation, he will then schedule her for the procedure. Upon receiving results from the procedure, pt will be notified by GYN for a f/u appt with MD. Pt verbalized understanding of the current plan of care. No further concerns.

## 2015-09-18 ENCOUNTER — Telehealth: Payer: Self-pay | Admitting: Gynecologic Oncology

## 2015-09-18 NOTE — Telephone Encounter (Signed)
Left message for patient stating we would need to move her surgery till after Jan 6 since she is having her colonscopy on Jan 6.  We were holding time for Jan 5 but advised we could move the surgical date to Jan 10 or Jan 17.  Advised she would need to be seen in the office prior to her procedure as well.  Advised to call the office so a date could be selected.

## 2015-09-19 ENCOUNTER — Telehealth: Payer: Self-pay | Admitting: Gynecologic Oncology

## 2015-09-19 NOTE — Telephone Encounter (Signed)
Left message for patient stating we would need to move her surgery till after Jan 6 since she is having her colonscopy on Jan 6. We were holding time for Jan 5 but advised we could move the surgical date to Jan 10 or Jan 17. Advised she would need to be seen in the office prior to her procedure as well. Advised to call the office so a date could be selected.

## 2015-09-20 ENCOUNTER — Telehealth: Payer: Self-pay | Admitting: Gynecologic Oncology

## 2015-09-20 NOTE — Telephone Encounter (Signed)
FAXED OFFICE NOTE TO PCP NB:[id number] RELEASE ID)

## 2015-09-27 ENCOUNTER — Telehealth: Payer: Self-pay

## 2015-09-27 NOTE — Telephone Encounter (Signed)
Orders received from [place] to contact the patient and update that we scheduled her surgery for 10/10/2015 with Dr Everitt Amber and to also remind her of her required scheduled colonoscopy 10/06/2015 . Patient contacted and updated , patient states she is aware of her scheduled colonoscopy on 10/06/2015 , but she is unable to proceed with the scheduled surgery date of 10/10/2015 due to child care issues. Patient is requesting to reschedule surgery to a later date , writer informed the patient that Dr Denman George will be in tomorrow and will be updated with her request to reschedule her surgery to a later date , patient denies further questions at this time.

## 2015-09-29 ENCOUNTER — Telehealth: Payer: Self-pay

## 2015-09-29 NOTE — Telephone Encounter (Signed)
Patient contacted to update that we have an opening to move her surgery from January 10 , 2017 to January 17 , 2017 at 2 PM per her request . No answer , left a detailed message with new date and time of surgery 10/17/2015 at 2 PM as requested . Call back information given if additional questions arise .

## 2015-10-06 ENCOUNTER — Other Ambulatory Visit: Payer: Self-pay | Admitting: Gastroenterology

## 2015-10-09 ENCOUNTER — Ambulatory Visit: Payer: BLUE CROSS/BLUE SHIELD | Admitting: Gynecologic Oncology

## 2015-10-10 ENCOUNTER — Encounter: Payer: Self-pay | Admitting: Gynecologic Oncology

## 2015-10-10 ENCOUNTER — Ambulatory Visit: Payer: BLUE CROSS/BLUE SHIELD | Attending: Gynecologic Oncology | Admitting: Gynecologic Oncology

## 2015-10-10 VITALS — BP 173/99 | HR 82 | Temp 99.2°F | Resp 20 | Wt 258.9 lb

## 2015-10-10 DIAGNOSIS — R1900 Intra-abdominal and pelvic swelling, mass and lump, unspecified site: Secondary | ICD-10-CM

## 2015-10-10 NOTE — Patient Instructions (Addendum)
YOUR PROCEDURE IS SCHEDULED ON :  10/17/15  REPORT TO [place] MAIN ENTRANCE FOLLOW SIGNS TO EAST ELEVATOR - GO TO 3rd FLOOR CHECK IN AT 3 EAST NURSES STATION (SHORT STAY) AT:  8:30 AM  CALL THIS NUMBER IF YOU HAVE PROBLEMS THE MORNING OF SURGERY [phone]  REMEMBER:ONLY 1 PER PERSON MAY GO TO SHORT STAY WITH YOU TO GET READY THE MORNING OF YOUR SURGERY  DO NOT EAT FOOD OR DRINK LIQUIDS AFTER MIDNIGHT  TAKE THESE MEDICINES THE MORNING OF SURGERY: CARVEDILOL  CLEAR LIQUIDS ONLY THE DAY BEFORE SURGERY  CLEAR LIQUID DIET   Foods Allowed                                                                     Foods Excluded  Coffee and tea, regular and decaf                             liquids that you cannot  Plain Jell-O in any flavor                                             see through such as: Fruit ices (not with fruit pulp)                                     milk, soups, orange juice  Iced Popsicles                                                     All solid food Cranberry, grape and apple juices Sports drinks like Gatorade Lightly seasoned clear broth or consume(fat free) Sugar, honey syrup  NO CARBONATED BEVERAGES _____________________________________________________________________    YOU MAY NOT HAVE ANY METAL ON YOUR BODY INCLUDING HAIR PINS AND PIERCING'S. DO NOT WEAR JEWELRY, MAKEUP, LOTIONS, POWDERS OR PERFUMES. DO NOT WEAR NAIL POLISH. DO NOT SHAVE 48 HRS PRIOR TO SURGERY. MEN MAY SHAVE FACE AND NECK.  DO NOT [place]. [place] IS NOT RESPONSIBLE FOR VALUABLES.  CONTACTS, DENTURES OR PARTIALS MAY NOT BE WORN TO SURGERY. LEAVE SUITCASE IN CAR. CAN BE BROUGHT TO ROOM AFTER SURGERY.  PATIENTS DISCHARGED THE DAY OF SURGERY WILL NOT BE ALLOWED TO DRIVE HOME.  PLEASE READ OVER THE FOLLOWING INSTRUCTION SHEETS _________________________________________________________________________________               [place] - PREPARING FOR SURGERY  Before surgery, you can play an important role.  Because skin is not sterile, your skin needs to be as free of germs as possible.  You can reduce the number of germs on your skin by washing with CHG (chlorahexidine gluconate) soap before surgery.  CHG is an antiseptic cleaner which kills germs and bonds with the skin to continue killing germs even after washing. Please DO NOT use if you have an  allergy to CHG or antibacterial soaps.  If your skin becomes reddened/irritated stop using the CHG and inform your nurse when you arrive at Short Stay. Do not shave (including legs and underarms) for at least 48 hours prior to the first CHG shower.  You may shave your face. Please follow these instructions carefully:   1.  Shower with CHG Soap the night before surgery and the  morning of Surgery.   2.  If you choose to wash your hair, wash your hair first as usual with your  normal  Shampoo.   3.  After you shampoo, rinse your hair and body thoroughly to remove the  shampoo.                                         4.  Use CHG as you would any other liquid soap.  You can apply chg directly  to the skin and wash . Gently wash with scrungie or clean wascloth    5.  Apply the CHG Soap to your body ONLY FROM THE NECK DOWN.   Do not use on open                           Wound or open sores. Avoid contact with eyes, ears mouth and genitals (private parts).                        Genitals (private parts) with your normal soap.              6.  Wash thoroughly, paying special attention to the area where your surgery  will be performed.   7.  Thoroughly rinse your body with warm water from the neck down.   8.  DO NOT shower/wash with your normal soap after using and rinsing off  the CHG Soap .                9.  Pat yourself dry with a clean towel.             10.  Wear clean night clothes to bed after shower             11.  Place clean sheets on your bed  the night of your first shower and do not  sleep with pets.  Day of Surgery : Do not apply any lotions/deodorants the morning of surgery.  Please wear clean clothes to the hospital/surgery center.  FAILURE TO FOLLOW THESE INSTRUCTIONS MAY RESULT IN THE CANCELLATION OF YOUR SURGERY    PATIENT SIGNATURE_________________________________  ______________________________________________________________________     Renee Oconnor  An incentive spirometer is a tool that can help keep your lungs clear and active. This tool measures how well you are filling your lungs with each breath. Taking long deep breaths may help reverse or decrease the chance of developing breathing (pulmonary) problems (especially infection) following:  A long period of time when you are unable to move or be active. BEFORE THE PROCEDURE   If the spirometer includes an indicator to show your best effort, your nurse or respiratory therapist will set it to a desired goal.  If possible, sit up straight or lean slightly forward. Try not to slouch.  Hold the incentive spirometer in an upright position. INSTRUCTIONS FOR USE   Sit on the edge of your  bed if possible, or sit up as far as you can in bed or on a chair.  Hold the incentive spirometer in an upright position.  Breathe out normally.  Place the mouthpiece in your mouth and seal your lips tightly around it.  Breathe in slowly and as deeply as possible, raising the piston or the ball toward the top of the column.  Hold your breath for 3-5 seconds or for as long as possible. Allow the piston or ball to fall to the bottom of the column.  Remove the mouthpiece from your mouth and breathe out normally.  Rest for a few seconds and repeat Steps 1 through 7 at least 10 times every 1-2 hours when you are awake. Take your time and take a few normal breaths between deep breaths.  The spirometer may include an indicator to show your best effort. Use the  indicator as a goal to work toward during each repetition.  After each set of 10 deep breaths, practice coughing to be sure your lungs are clear. If you have an incision (the cut made at the time of surgery), support your incision when coughing by placing a pillow or rolled up towels firmly against it. Once you are able to get out of bed, walk around indoors and cough well. You may stop using the incentive spirometer when instructed by your caregiver.  RISKS AND COMPLICATIONS  Take your time so you do not get dizzy or light-headed.  If you are in pain, you may need to take or ask for pain medication before doing incentive spirometry. It is harder to take a deep breath if you are having pain. AFTER USE  Rest and breathe slowly and easily.  It can be helpful to keep track of a log of your progress. Your caregiver can provide you with a simple table to help with this. If you are using the spirometer at home, follow these instructions: [place] IF:   You are having difficultly using the spirometer.  You have trouble using the spirometer as often as instructed.  Your pain medication is not giving enough relief while using the spirometer.  You develop fever of 100.5 F (38.1 C) or higher. SEEK IMMEDIATE MEDICAL CARE IF:   You cough up bloody sputum that had not been present before.  You develop fever of 102 F (38.9 C) or greater.  You develop worsening pain at or near the incision site. MAKE SURE YOU:   Understand these instructions.  Will watch your condition.  Will get help right away if you are not doing well or get worse. Document Released: 01/27/2007 Document Revised: 12/09/2011 Document Reviewed: 03/30/2007 Exit[employer] Patient Information 2014 [employer], [place].   ________________________________________________________________________  WHAT IS A BLOOD TRANSFUSION? Blood Transfusion Information  A transfusion is the replacement of blood or some of its parts. Blood  is made up of multiple cells which provide different functions.  Red blood cells carry oxygen and are used for blood loss replacement.  White blood cells fight against infection.  Platelets control bleeding.  Plasma helps clot blood.  Other blood products are available for specialized needs, such as hemophilia or other clotting disorders. BEFORE THE TRANSFUSION  Who gives blood for transfusions?   Healthy volunteers who are fully evaluated to make sure their blood is safe. This is blood bank blood. Transfusion therapy is the safest it has ever been in the practice of medicine. Before blood is taken from a donor, a complete history is taken to  make sure that person has no history of diseases nor engages in risky social behavior (examples are intravenous drug use or sexual activity with multiple partners). The donor's travel history is screened to minimize risk of transmitting infections, such as malaria. The donated blood is tested for signs of infectious diseases, such as HIV and hepatitis. The blood is then tested to be sure it is compatible with you in order to minimize the chance of a transfusion reaction. If you or a relative donates blood, this is often done in anticipation of surgery and is not appropriate for emergency situations. It takes many days to process the donated blood. RISKS AND COMPLICATIONS Although transfusion therapy is very safe and saves many lives, the main dangers of transfusion include:   Getting an infectious disease.  Developing a transfusion reaction. This is an allergic reaction to something in the blood you were given. Every precaution is taken to prevent this. The decision to have a blood transfusion has been considered carefully by your caregiver before blood is given. Blood is not given unless the benefits outweigh the risks. AFTER THE TRANSFUSION  Right after receiving a blood transfusion, you will usually feel much better and more energetic. This is  especially true if your red blood cells have gotten low (anemic). The transfusion raises the level of the red blood cells which carry oxygen, and this usually causes an energy increase.  The nurse administering the transfusion will monitor you carefully for complications. HOME CARE INSTRUCTIONS  No special instructions are needed after a transfusion. You may find your energy is better. Speak with your caregiver about any limitations on activity for underlying diseases you may have. SEEK MEDICAL CARE IF:   Your condition is not improving after your transfusion.  You develop redness or irritation at the intravenous (IV) site. SEEK IMMEDIATE MEDICAL CARE IF:  Any of the following symptoms occur over the next 12 hours:  Shaking chills.  You have a temperature by mouth above 102 F (38.9 C), not controlled by medicine.  Chest, back, or muscle pain.  People around you feel you are not acting correctly or are confused.  Shortness of breath or difficulty breathing.  Dizziness and fainting.  You get a rash or develop hives.  You have a decrease in urine output.  Your urine turns a dark color or changes to pink, red, or brown. Any of the following symptoms occur over the next 10 days:  You have a temperature by mouth above 102 F (38.9 C), not controlled by medicine.  Shortness of breath.  Weakness after normal activity.  The white part of the eye turns yellow (jaundice).  You have a decrease in the amount of urine or are urinating less often.  Your urine turns a dark color or changes to pink, red, or brown. Document Released: 09/13/2000 Document Revised: 12/09/2011 Document Reviewed: 05/02/2008 [facility] Patient Information 2014 [place], [place].  _______________________________________________________________________

## 2015-10-10 NOTE — Progress Notes (Signed)
Followup Note: Gyn-Onc  Consult was initially requested inpatient by Dr. Earlie Counts on 07/31/15 for the evaluation of Renee Oconnor 45 y.o. female With a 15cm pelvic/abdominal mass  CC:  Chief Complaint  Patient presents with  . pelvic mass    Pre-Op   Assessment/Plan:  Renee Oconnor is a 45 y.o. 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  She is improving from her infectious process. Her colonoscopy on 10/09/15 was unremarkable.  I have personally reviewed her CT images. I have a low suspicion for this representing a malignancy. However, due to its size, I recommend surgical removal. We again discussed possible complications of GI involvement/adhesions requiring GI resection and possibly colostomy. I discussed risks of wound infection, VTE, prolonged hospitalization.  She is scheduled for a TAH, LSO, possible staging, possible bowel resection on 10/16/14.  Mag citrate mechanical bowel prep preop.  HPI: Renee Oconnor is a 45 year old woman who was seen in consultation at the request of Dr Wyline Copas for a large ovarian mass.  The patient was aditted on 07/28/15 for presumed colitis due to CT findings, fevers, and GI symptoms.  CT of the abdomen and pelvis on 07/28/15 showed : There is a large multi-septated cystic mass arising from the pelvis which measure 14.8 x 14.4 x 13.3 cm. It is difficult to determine exact origin of this tumor, but most likely is ovarian in etiology. There also appears to be inflammation involving a portion of the sigmoid colon that runs superior to this mass. Adjacent to this inflamed bowel loop, there is noted 3.1 x 2.3 cm low density which may represent adjacent abscess..  The patient reported symptoms prior to admission of 2 weeks of epigastric and mid abdominal pain, intermittent nausea and emesis and intermittent obstipation.  She has a remote hx in 2006 of  an ex lap, myomectomy and colonic resection with reanastamosis with Dr Raphael Gibney. This was reversed 6 months later.   She has no family hx concerning from breast/ovarian cancer syndrome.  She was treated with IV antibiotics and then discharged when she was clinically improved.   Interval Hx: She denies fevers or chills or pain. She had a colonoscopy on 10/09/15 at [facility] which, per patient, was normal.  Current Outpatient Prescriptions on File Prior to Visit  Medication Sig Dispense Refill  . aspirin-acetaminophen-caffeine (EXCEDRIN MIGRAINE) 250-250-65 MG tablet Take 1 tablet by mouth every 6 (six) hours as needed for headache.    . carvedilol (COREG) 3.125 MG tablet Take 1.56 mg by mouth 2 (two) times daily with a meal.     . docusate sodium (COLACE) 100 MG capsule Take 1 capsule (100 mg total) by mouth 2 (two) times daily. 30 capsule 0  . ferrous sulfate 325 (65 FE) MG tablet Take 1 tablet (325 mg total) by mouth 2 (two) times daily with a meal. 30 tablet 2  . hydrochlorothiazide (HYDRODIURIL) 50 MG tablet Take 25 mg by mouth daily.    . polyethylene glycol (MIRALAX) packet Take 17 g by mouth daily. 14 each 0  . ketorolac (TORADOL) 10 MG tablet Take 1 tablet (10 mg total) by mouth every 6 (six) hours as needed. (Patient not taking: Reported on 10/10/2015) 20 tablet 0  . [DISCONTINUED] amLODipine-valsartan (EXFORGE) 10-160 MG per tablet Take 1 tablet by mouth daily.     No current facility-administered medications on file prior to visit.  No Known Allergies Past Medical History  Diagnosis Date  . Hypertension   . Diabetes mellitus   . High cholesterol    Past Surgical History  Procedure Laterality Date  . Ectopic pregnancy surgery    . Bowel resection       Past Gynecological History: premenopausal Patient's last menstrual period was 07/19/2015.  Family Hx:  Family History  Problem Relation Age of Onset  . Family history unknown: Yes   Social History    Social History  . Marital Status: Married    Spouse Name: N/A  . Number of Children: N/A  . Years of Education: N/A   Occupational History  . Not on file.   Social History Main Topics  . Smoking status: Never Smoker   . Smokeless tobacco: Never Used  . Alcohol Use: No  . Drug Use: No  . Sexual Activity: Not Currently   Other Topics Concern  . Not on file   Social History Narrative     Review of Systems:  Constitutional  Feels well,  ENT Normal appearing ears and nares bilaterally Skin/Breast  No rash, sores, jaundice, itching, dryness Cardiovascular  No chest pain, shortness of breath, or edema  Pulmonary  No cough or wheeze.  Gastro Intestinal  See HPI Genito Urinary  No frequency, urgency, dysuria,  Musculo Skeletal  No myalgia, arthralgia, joint swelling or pain  Neurologic  No weakness, numbness, change in gait,  Psychology  No depression, anxiety, insomnia.   Vitals: BP 173/99 mmHg  Pulse 82  Temp(Src) 99.2 F (37.3 C) (Oral)  Resp 20  Wt 258 lb 14.4 oz (117.436 kg)  SpO2 100%.  Physical Exam: WD in NAD Neck  Supple NROM, without any enlargements.  Lymph Node Survey No cervical supraclavicular or inguinal adenopathy Skin  No rash/lesions/breakdown  Psychiatry  Alert and oriented to person, place, and time  Abdomen  Normoactive bowel sounds, abdomen soft, non-tender and obese without evidence of hernia. Mass palpable in mid abdomen. Back No CVA tenderness Genito Urinary:   Normal external female genitalia. Vagina: normal. Cervix: normal in appearance and palpation. Uterus: slightly bulky, mobile. Adnexa: Large, solid and cystic mass arising from posterior to uterus and into lower abdomen. Rectal  Soft, no palpable transmural lesions. Pelvic mass appreciated on rectal exam. Extremities  No bilateral cyanosis, clubbing or edema.     Donaciano Eva, MD

## 2015-10-10 NOTE — Patient Instructions (Signed)
Preparing for your Surgery  Plan for surgery on October 17, 2015 with Dr. Everitt Amber. You will be scheduled for an exploratory laparotomy, total abdominal hysterectomy, left salpingo-oophorectomy, possible bowel resection, possible staging.    Pre-operative Testing -You will receive a phone call from presurgical testing at [facility] to arrange for a pre-operative testing appointment before your surgery.  This appointment normally occurs one to two weeks before your scheduled surgery.   -Bring your insurance card, copy of an advanced directive if applicable, medication list  -At that visit, you will be asked to sign a consent for a possible blood transfusion in case a transfusion becomes necessary during surgery.  The need for a blood transfusion is rare but having consent is a necessary part of your care.     -You should not be taking blood thinners or aspirin at least ten days prior to surgery unless instructed by your surgeon.  Day Before Surgery at [place] will be asked to take in only clear liquids the day before surgery.  Examples of clear liquids include broths, jello, and clear juices.  Avoid carbonated beverages.  You will be advised to have nothing to eat or drink after midnight the evening before.  You will need to drink two bottles of magnesium citrate the morning before surgery.   Your role in recovery Your role is to become active as soon as directed by your doctor, while still giving yourself time to heal.  Rest when you feel tired. You will be asked to do the following in order to speed your recovery:  - Cough and breathe deeply. This helps toclear and expand your lungs and can prevent pneumonia. You may be given a spirometer to practice deep breathing. A staff member will show you how to use the spirometer. - Do mild physical activity. Walking or moving your legs help your circulation and body functions return to normal. A staff member will help you when  you try to walk and will provide you with simple exercises. Do not try to get up or walk alone the first time. - Actively manage your pain. Managing your pain lets you move in comfort. We will ask you to rate your pain on a scale of zero to 10. It is your responsibility to tell your doctor or nurse where and how much you hurt so your pain can be treated.  Special Considerations -If you are diabetic, you may be placed on insulin after surgery to have closer control over your blood sugars to promote healing and recovery.  This does not mean that you will be discharged on insulin.  If applicable, your oral antidiabetics will be resumed when you are tolerating a solid diet.  -Your final pathology results from surgery should be available by the Friday after surgery and the results will be relayed to you when available.  Blood Transfusion Information WHAT IS A BLOOD TRANSFUSION? A transfusion is the replacement of blood or some of its parts. Blood is made up of multiple cells which provide different functions.  Red blood cells carry oxygen and are used for blood loss replacement.  White blood cells fight against infection.  Platelets control bleeding.  Plasma helps clot blood.  Other blood products are available for specialized needs, such as hemophilia or other clotting disorders. BEFORE THE TRANSFUSION  Who gives blood for transfusions?   You may be able to donate blood to be used at a later date on yourself (autologous donation).  Relatives can  be asked to donate blood. This is generally not any safer than if you have received blood from a stranger. The same precautions are taken to ensure safety when a relative's blood is donated.  Healthy volunteers who are fully evaluated to make sure their blood is safe. This is blood bank blood. Transfusion therapy is the safest it has ever been in the practice of medicine. Before blood is taken from a donor, a complete history is taken to make sure  that person has no history of diseases nor engages in risky social behavior (examples are intravenous drug use or sexual activity with multiple partners). The donor's travel history is screened to minimize risk of transmitting infections, such as malaria. The donated blood is tested for signs of infectious diseases, such as HIV and hepatitis. The blood is then tested to be sure it is compatible with you in order to minimize the chance of a transfusion reaction. If you or a relative donates blood, this is often done in anticipation of surgery and is not appropriate for emergency situations. It takes many days to process the donated blood. RISKS AND COMPLICATIONS Although transfusion therapy is very safe and saves many lives, the main dangers of transfusion include:   Getting an infectious disease.  Developing a transfusion reaction. This is an allergic reaction to something in the blood you were given. Every precaution is taken to prevent this. The decision to have a blood transfusion has been considered carefully by your caregiver before blood is given. Blood is not given unless the benefits outweigh the risks.

## 2015-10-12 ENCOUNTER — Encounter (HOSPITAL_COMMUNITY): Payer: Self-pay

## 2015-10-12 ENCOUNTER — Encounter (HOSPITAL_COMMUNITY)
Admission: RE | Admit: 2015-10-12 | Discharge: 2015-10-12 | Disposition: A | Discharge status: Home / Self Care | Payer: BLUE CROSS/BLUE SHIELD | Source: Ambulatory Visit | Attending: Gynecologic Oncology | Admitting: Gynecologic Oncology

## 2015-10-12 DIAGNOSIS — R1900 Intra-abdominal and pelvic swelling, mass and lump, unspecified site: Secondary | ICD-10-CM | POA: Insufficient documentation

## 2015-10-12 DIAGNOSIS — Z01812 Encounter for preprocedural laboratory examination: Secondary | ICD-10-CM | POA: Diagnosis present

## 2015-10-12 HISTORY — DX: Prediabetes: R73.03

## 2015-10-12 HISTORY — DX: Personal history of other medical treatment: Z92.89

## 2015-10-12 HISTORY — DX: Dermatitis, unspecified: L30.9

## 2015-10-12 LAB — URINALYSIS, ROUTINE W REFLEX MICROSCOPIC
BILIRUBIN URINE: NEGATIVE
GLUCOSE, UA: NEGATIVE mg/dL
Hgb urine dipstick: NEGATIVE
KETONES UR: NEGATIVE mg/dL
Leukocytes, UA: NEGATIVE
Nitrite: NEGATIVE
PH: 7 (ref 5.0–8.0)
Protein, ur: NEGATIVE mg/dL
SPECIFIC GRAVITY, URINE: 1.014 (ref 1.005–1.030)

## 2015-10-12 LAB — CBC WITH DIFFERENTIAL/PLATELET
BASOS ABS: 0 10*3/uL (ref 0.0–0.1)
Basophils Relative: 1 %
EOS ABS: 0.1 10*3/uL (ref 0.0–0.7)
Eosinophils Relative: 2 %
HEMATOCRIT: 35.4 % — AB (ref 36.0–46.0)
Hemoglobin: 10.3 g/dL — ABNORMAL LOW (ref 12.0–15.0)
LYMPHS ABS: 1.4 10*3/uL (ref 0.7–4.0)
Lymphocytes Relative: 32 %
MCH: 22.7 pg — ABNORMAL LOW (ref 26.0–34.0)
MCHC: 29.1 g/dL — AB (ref 30.0–36.0)
MCV: 78.1 fL (ref 78.0–100.0)
Monocytes Absolute: 0.3 10*3/uL (ref 0.1–1.0)
Monocytes Relative: 7 %
NEUTROS ABS: 2.6 10*3/uL (ref 1.7–7.7)
Neutrophils Relative %: 58 %
Platelets: 391 10*3/uL (ref 150–400)
RBC: 4.53 MIL/uL (ref 3.87–5.11)
RDW: 21.7 % — AB (ref 11.5–15.5)
WBC: 4.4 10*3/uL (ref 4.0–10.5)

## 2015-10-12 LAB — COMPREHENSIVE METABOLIC PANEL
ALBUMIN: 4.2 g/dL (ref 3.5–5.0)
ALK PHOS: 65 U/L (ref 38–126)
ALT: 22 U/L (ref 14–54)
AST: 18 U/L (ref 15–41)
Anion gap: 9 (ref 5–15)
BILIRUBIN TOTAL: 0.7 mg/dL (ref 0.3–1.2)
BUN: 19 mg/dL (ref 6–20)
CALCIUM: 10 mg/dL (ref 8.9–10.3)
CO2: 29 mmol/L (ref 22–32)
Chloride: 101 mmol/L (ref 101–111)
Creatinine, Ser: 0.79 mg/dL (ref 0.44–1.00)
GFR calc Af Amer: >60 mL/min (ref >60)
GFR calc non Af Amer: >60 mL/min (ref >60)
GLUCOSE: 92 mg/dL (ref 65–99)
Potassium: 4.1 mmol/L (ref 3.5–5.1)
SODIUM: 139 mmol/L (ref 135–145)
TOTAL PROTEIN: 9 g/dL — AB (ref 6.5–8.1)

## 2015-10-12 LAB — PREGNANCY, URINE: Preg Test, Ur: NEGATIVE

## 2015-10-17 ENCOUNTER — Encounter (HOSPITAL_COMMUNITY): Payer: Self-pay | Admitting: *Deleted

## 2015-10-17 ENCOUNTER — Encounter (HOSPITAL_COMMUNITY)
Admission: RE | Disposition: A | Discharge status: Home / Self Care | Payer: Self-pay | Source: Ambulatory Visit | Attending: Gynecologic Oncology

## 2015-10-17 ENCOUNTER — Inpatient Hospital Stay (HOSPITAL_COMMUNITY): Payer: BLUE CROSS/BLUE SHIELD | Admitting: Certified Registered Nurse Anesthetist

## 2015-10-17 ENCOUNTER — Inpatient Hospital Stay (HOSPITAL_COMMUNITY)
Admission: RE | Admit: 2015-10-17 | Discharge: 2015-10-21 | DRG: 742 | Disposition: A | Discharge status: Home / Self Care | Payer: BLUE CROSS/BLUE SHIELD | Source: Ambulatory Visit | Attending: Gynecologic Oncology | Admitting: Gynecologic Oncology

## 2015-10-17 DIAGNOSIS — Z6841 Body Mass Index (BMI) 40.0 and over, adult: Secondary | ICD-10-CM

## 2015-10-17 DIAGNOSIS — R1900 Intra-abdominal and pelvic swelling, mass and lump, unspecified site: Secondary | ICD-10-CM | POA: Diagnosis present

## 2015-10-17 DIAGNOSIS — K567 Ileus, unspecified: Secondary | ICD-10-CM | POA: Diagnosis not present

## 2015-10-17 DIAGNOSIS — N99820 Postprocedural hemorrhage and hematoma of a genitourinary system organ or structure following a genitourinary system procedure: Secondary | ICD-10-CM | POA: Diagnosis not present

## 2015-10-17 DIAGNOSIS — K660 Peritoneal adhesions (postprocedural) (postinfection): Secondary | ICD-10-CM | POA: Diagnosis present

## 2015-10-17 DIAGNOSIS — N839 Noninflammatory disorder of ovary, fallopian tube and broad ligament, unspecified: Secondary | ICD-10-CM | POA: Diagnosis present

## 2015-10-17 DIAGNOSIS — N7093 Salpingitis and oophoritis, unspecified: Principal | ICD-10-CM | POA: Diagnosis present

## 2015-10-17 DIAGNOSIS — E119 Type 2 diabetes mellitus without complications: Secondary | ICD-10-CM | POA: Diagnosis present

## 2015-10-17 DIAGNOSIS — Z79899 Other long term (current) drug therapy: Secondary | ICD-10-CM | POA: Diagnosis not present

## 2015-10-17 DIAGNOSIS — E7800 Pure hypercholesterolemia, unspecified: Secondary | ICD-10-CM | POA: Diagnosis present

## 2015-10-17 DIAGNOSIS — N838 Other noninflammatory disorders of ovary, fallopian tube and broad ligament: Secondary | ICD-10-CM | POA: Diagnosis present

## 2015-10-17 DIAGNOSIS — K529 Noninfective gastroenteritis and colitis, unspecified: Secondary | ICD-10-CM | POA: Diagnosis present

## 2015-10-17 DIAGNOSIS — Z01812 Encounter for preprocedural laboratory examination: Secondary | ICD-10-CM

## 2015-10-17 DIAGNOSIS — I10 Essential (primary) hypertension: Secondary | ICD-10-CM | POA: Diagnosis present

## 2015-10-17 HISTORY — PX: ABDOMINAL HYSTERECTOMY: SHX81

## 2015-10-17 HISTORY — PX: LAPAROTOMY: SHX154

## 2015-10-17 LAB — GLUCOSE, CAPILLARY: GLUCOSE-CAPILLARY: 81 mg/dL (ref 65–99)

## 2015-10-17 SURGERY — LAPAROTOMY, EXPLORATORY
Anesthesia: General

## 2015-10-17 MED ORDER — ROCURONIUM BROMIDE 100 MG/10ML IV SOLN
INTRAVENOUS | Status: AC
  Filled 2015-10-17: qty 1

## 2015-10-17 MED ORDER — GABAPENTIN 600 MG PO TABS
600.0000 mg | ORAL_TABLET | Freq: Every day | ORAL | Status: DC
  Filled 2015-10-17: qty 1

## 2015-10-17 MED ORDER — LACTATED RINGERS IV SOLN: INTRAVENOUS | Status: DC

## 2015-10-17 MED ORDER — ONDANSETRON HCL 4 MG/2ML IJ SOLN
INTRAMUSCULAR | Status: DC | PRN
  Administered 2015-10-17: 4 mg via INTRAVENOUS

## 2015-10-17 MED ORDER — TRAMADOL HCL 50 MG PO TABS
100.0000 mg | ORAL_TABLET | Freq: Four times a day (QID) | ORAL | Status: DC
  Administered 2015-10-18 – 2015-10-21 (×15): 100 mg via ORAL
  Filled 2015-10-17 (×15): qty 2

## 2015-10-17 MED ORDER — ONDANSETRON HCL 4 MG PO TABS: 4.0000 mg | ORAL_TABLET | Freq: Four times a day (QID) | ORAL | Status: DC | PRN

## 2015-10-17 MED ORDER — MIDAZOLAM HCL 2 MG/2ML IJ SOLN
INTRAMUSCULAR | Status: AC
  Filled 2015-10-17: qty 2

## 2015-10-17 MED ORDER — ONDANSETRON HCL 4 MG/2ML IJ SOLN
INTRAMUSCULAR | Status: AC
  Filled 2015-10-17: qty 2

## 2015-10-17 MED ORDER — SODIUM CHLORIDE 0.9 % IV SOLN
INTRAVENOUS | Status: DC | PRN
  Administered 2015-10-17: 13:00:00 via INTRAVENOUS

## 2015-10-17 MED ORDER — CARVEDILOL 3.125 MG PO TABS
1.5600 mg | ORAL_TABLET | Freq: Two times a day (BID) | ORAL | Status: DC
  Administered 2015-10-18 – 2015-10-21 (×7): 1.56 mg via ORAL
  Filled 2015-10-17 (×9): qty 0.5

## 2015-10-17 MED ORDER — DEXTROSE 5 % IV SOLN
INTRAVENOUS | Status: AC
  Filled 2015-10-17: qty 2

## 2015-10-17 MED ORDER — BUPIVACAINE LIPOSOME 1.3 % IJ SUSP
20.0000 mL | Freq: Once | INTRAMUSCULAR | Status: DC
  Filled 2015-10-17: qty 20

## 2015-10-17 MED ORDER — PROPOFOL 10 MG/ML IV BOLUS
INTRAVENOUS | Status: DC | PRN
  Administered 2015-10-17: 200 mg via INTRAVENOUS
  Administered 2015-10-17: 25 mg via INTRAVENOUS

## 2015-10-17 MED ORDER — DEXTROSE 5 % IV SOLN
2.0000 g | INTRAVENOUS | Status: AC
  Administered 2015-10-17 (×2): 2 g via INTRAVENOUS

## 2015-10-17 MED ORDER — ENOXAPARIN SODIUM 40 MG/0.4ML ~~LOC~~ SOLN
40.0000 mg | SUBCUTANEOUS | Status: DC
  Administered 2015-10-18 – 2015-10-21 (×4): 40 mg via SUBCUTANEOUS
  Filled 2015-10-17 (×5): qty 0.4

## 2015-10-17 MED ORDER — CEFOXITIN SODIUM 2 G IV SOLR
INTRAVENOUS | Status: AC
  Filled 2015-10-17: qty 2

## 2015-10-17 MED ORDER — DEXTROSE 5 % IV SOLN
INTRAVENOUS | Status: AC
  Filled 2015-10-17: qty 50

## 2015-10-17 MED ORDER — OXYCODONE HCL 5 MG PO TABS
5.0000 mg | ORAL_TABLET | ORAL | Status: DC | PRN
  Administered 2015-10-17: 5 mg via ORAL
  Filled 2015-10-17: qty 1

## 2015-10-17 MED ORDER — ACETAMINOPHEN 500 MG PO TABS
1000.0000 mg | ORAL_TABLET | Freq: Four times a day (QID) | ORAL | Status: DC
  Administered 2015-10-18 – 2015-10-21 (×14): 1000 mg via ORAL
  Filled 2015-10-17 (×23): qty 2

## 2015-10-17 MED ORDER — FENTANYL CITRATE (PF) 100 MCG/2ML IJ SOLN
INTRAMUSCULAR | Status: AC
  Filled 2015-10-17: qty 2

## 2015-10-17 MED ORDER — HYDROMORPHONE HCL 2 MG/ML IJ SOLN
INTRAMUSCULAR | Status: AC
  Filled 2015-10-17: qty 1

## 2015-10-17 MED ORDER — HYDROMORPHONE HCL 1 MG/ML IJ SOLN: 0.5000 mg | INTRAMUSCULAR | Status: DC | PRN

## 2015-10-17 MED ORDER — FENTANYL CITRATE (PF) 100 MCG/2ML IJ SOLN
25.0000 ug | INTRAMUSCULAR | Status: DC | PRN
  Administered 2015-10-17 (×3): 50 ug via INTRAVENOUS

## 2015-10-17 MED ORDER — PROPOFOL 10 MG/ML IV BOLUS
INTRAVENOUS | Status: AC
  Filled 2015-10-17: qty 20

## 2015-10-17 MED ORDER — SODIUM CHLORIDE 0.9 % IJ SOLN
INTRAMUSCULAR | Status: AC
  Filled 2015-10-17: qty 200

## 2015-10-17 MED ORDER — KCL IN DEXTROSE-NACL 20-5-0.45 MEQ/L-%-% IV SOLN
INTRAVENOUS | Status: DC
  Administered 2015-10-17 – 2015-10-20 (×3): via INTRAVENOUS
  Administered 2015-10-20: 100 mL/h via INTRAVENOUS
  Filled 2015-10-17 (×9): qty 1000

## 2015-10-17 MED ORDER — 0.9 % SODIUM CHLORIDE (POUR BTL) OPTIME
TOPICAL | Status: DC | PRN
  Administered 2015-10-17: 5000 mL

## 2015-10-17 MED ORDER — LACTATED RINGERS IV SOLN
INTRAVENOUS | Status: DC
  Administered 2015-10-17: 1000 mL via INTRAVENOUS
  Administered 2015-10-17 (×3): via INTRAVENOUS

## 2015-10-17 MED ORDER — MAGNESIUM HYDROXIDE 400 MG/5ML PO SUSP
30.0000 mL | Freq: Three times a day (TID) | ORAL | Status: AC
  Administered 2015-10-17 – 2015-10-18 (×3): 30 mL via ORAL
  Filled 2015-10-17 (×3): qty 30

## 2015-10-17 MED ORDER — LIDOCAINE HCL (CARDIAC) 20 MG/ML IV SOLN
INTRAVENOUS | Status: AC
  Filled 2015-10-17: qty 5

## 2015-10-17 MED ORDER — SUGAMMADEX SODIUM 200 MG/2ML IV SOLN
INTRAVENOUS | Status: AC
  Filled 2015-10-17: qty 2

## 2015-10-17 MED ORDER — ENOXAPARIN SODIUM 40 MG/0.4ML ~~LOC~~ SOLN
40.0000 mg | SUBCUTANEOUS | Status: AC
  Administered 2015-10-17: 40 mg via SUBCUTANEOUS
  Filled 2015-10-17: qty 0.4

## 2015-10-17 MED ORDER — ONDANSETRON HCL 4 MG/2ML IJ SOLN: 4.0000 mg | Freq: Once | INTRAMUSCULAR | Status: DC | PRN

## 2015-10-17 MED ORDER — ENSURE ENLIVE PO LIQD
237.0000 mL | Freq: Two times a day (BID) | ORAL | Status: DC
  Administered 2015-10-18 – 2015-10-21 (×5): 237 mL via ORAL

## 2015-10-17 MED ORDER — PHENYLEPHRINE HCL 10 MG/ML IJ SOLN
INTRAMUSCULAR | Status: DC | PRN
  Administered 2015-10-17: 40 ug via INTRAVENOUS

## 2015-10-17 MED ORDER — SUGAMMADEX SODIUM 200 MG/2ML IV SOLN
INTRAVENOUS | Status: DC | PRN
  Administered 2015-10-17: 200 mg via INTRAVENOUS

## 2015-10-17 MED ORDER — ONDANSETRON HCL 4 MG/2ML IJ SOLN: 4.0000 mg | Freq: Four times a day (QID) | INTRAMUSCULAR | Status: DC | PRN

## 2015-10-17 MED ORDER — DEXAMETHASONE SODIUM PHOSPHATE 10 MG/ML IJ SOLN
INTRAMUSCULAR | Status: DC | PRN
  Administered 2015-10-17: 10 mg via INTRAVENOUS

## 2015-10-17 MED ORDER — BUPIVACAINE LIPOSOME 1.3 % IJ SUSP
INTRAMUSCULAR | Status: DC | PRN
  Administered 2015-10-17: 20 mL

## 2015-10-17 MED ORDER — ROCURONIUM BROMIDE 100 MG/10ML IV SOLN
INTRAVENOUS | Status: DC | PRN
  Administered 2015-10-17: 10 mg via INTRAVENOUS
  Administered 2015-10-17: 20 mg via INTRAVENOUS
  Administered 2015-10-17: 10 mg via INTRAVENOUS
  Administered 2015-10-17: 50 mg via INTRAVENOUS
  Administered 2015-10-17 (×3): 10 mg via INTRAVENOUS

## 2015-10-17 MED ORDER — GABAPENTIN 300 MG PO CAPS
600.0000 mg | ORAL_CAPSULE | Freq: Every day | ORAL | Status: AC
  Administered 2015-10-17: 600 mg via ORAL
  Filled 2015-10-17: qty 2

## 2015-10-17 MED ORDER — MIDAZOLAM HCL 5 MG/5ML IJ SOLN
INTRAMUSCULAR | Status: DC | PRN
  Administered 2015-10-17: 2 mg via INTRAVENOUS

## 2015-10-17 MED ORDER — HYDROMORPHONE HCL 1 MG/ML IJ SOLN
INTRAMUSCULAR | Status: DC | PRN
  Administered 2015-10-17 (×4): 0.5 mg via INTRAVENOUS

## 2015-10-17 MED ORDER — FENTANYL CITRATE (PF) 250 MCG/5ML IJ SOLN
INTRAMUSCULAR | Status: AC
  Filled 2015-10-17: qty 5

## 2015-10-17 MED ORDER — LIDOCAINE HCL (CARDIAC) 20 MG/ML IV SOLN
INTRAVENOUS | Status: DC | PRN
  Administered 2015-10-17: 100 mg via INTRAVENOUS

## 2015-10-17 MED ORDER — FENTANYL CITRATE (PF) 100 MCG/2ML IJ SOLN
INTRAMUSCULAR | Status: DC | PRN
  Administered 2015-10-17 (×3): 50 ug via INTRAVENOUS
  Administered 2015-10-17: 100 ug via INTRAVENOUS

## 2015-10-17 SURGICAL SUPPLY — 58 items
ATTRACTOMAT 16X20 MAGNETIC DRP (DRAPES) ×3 IMPLANT
BLADE EXTENDED COATED 6.5IN (ELECTRODE) ×3 IMPLANT
CHLORAPREP W/TINT 26ML (MISCELLANEOUS) ×3 IMPLANT
CLIP TI MEDIUM 6 (CLIP) ×6 IMPLANT
CLIP TI MEDIUM LARGE 6 (CLIP) ×1 IMPLANT
CONT SPEC 4OZ CLIKSEAL STRL BL (MISCELLANEOUS) ×3 IMPLANT
COVER SURGICAL LIGHT HANDLE (MISCELLANEOUS) ×3 IMPLANT
DRAPE INCISE IOBAN 66X45 STRL (DRAPES) ×3 IMPLANT
DRAPE UTILITY 15X26 (DRAPE) ×3 IMPLANT
DRAPE WARM FLUID 44X44 (DRAPE) ×3 IMPLANT
DRESSING TELFA ISLAND 4X8 (GAUZE/BANDAGES/DRESSINGS) ×3 IMPLANT
DRSG OPSITE POSTOP 4X10 (GAUZE/BANDAGES/DRESSINGS) IMPLANT
DRSG OPSITE POSTOP 4X12 (GAUZE/BANDAGES/DRESSINGS) ×1 IMPLANT
DRSG OPSITE POSTOP 4X8 (GAUZE/BANDAGES/DRESSINGS) IMPLANT
ELECT LIGASURE SHORT 9 REUSE (ELECTRODE) IMPLANT
ELECT REM PT RETURN 9FT ADLT (ELECTROSURGICAL) ×3
ELECTRODE REM PT RTRN 9FT ADLT (ELECTROSURGICAL) ×2 IMPLANT
GAUZE SPONGE 4X4 12PLY STRL (GAUZE/BANDAGES/DRESSINGS) ×3 IMPLANT
GAUZE SPONGE 4X4 16PLY XRAY LF (GAUZE/BANDAGES/DRESSINGS) ×3 IMPLANT
GLOVE BIO SURGEON STRL SZ 6 (GLOVE) ×6 IMPLANT
GLOVE BIO SURGEON STRL SZ 6.5 (GLOVE) ×6 IMPLANT
GOWN STRL REUS W/ TWL LRG LVL3 (GOWN DISPOSABLE) ×4 IMPLANT
GOWN STRL REUS W/TWL LRG LVL3 (GOWN DISPOSABLE) ×6
KIT BASIN OR (CUSTOM PROCEDURE TRAY) ×3 IMPLANT
LIGASURE IMPACT 36 18CM CVD LR (INSTRUMENTS) ×2 IMPLANT
LIQUID BAND (GAUZE/BANDAGES/DRESSINGS) IMPLANT
LOOP VESSEL MAXI BLUE (MISCELLANEOUS) IMPLANT
NDL BLUNT 17GA (NEEDLE) IMPLANT
NEEDLE BLUNT 17GA (NEEDLE) ×9 IMPLANT
NEEDLE HYPO 22GX1.5 SAFETY (NEEDLE) ×6 IMPLANT
NS IRRIG 1000ML POUR BTL (IV SOLUTION) ×4 IMPLANT
PACK GENERAL/GYN (CUSTOM PROCEDURE TRAY) ×3 IMPLANT
RELOAD PROXIMATE 55 WH X BL (ENDOMECHANICALS) ×6 IMPLANT
RELOAD STAPLE 55 2.6 WHT THIN (ENDOMECHANICALS) IMPLANT
SHEET LAVH (DRAPES) ×3 IMPLANT
SPONGE LAP 18X18 X RAY DECT (DISPOSABLE) ×2 IMPLANT
STAPLER GUN LINEAR PROX 60 (STAPLE) ×1 IMPLANT
STAPLER PROXIMATE 55 BLUE (STAPLE) ×1 IMPLANT
STAPLER VISISTAT 35W (STAPLE) ×3 IMPLANT
SUT MNCRL AB 4-0 PS2 18 (SUTURE) IMPLANT
SUT PDS AB 1 TP1 96 (SUTURE) ×6 IMPLANT
SUT PROLENE 5 0 CC 1 (SUTURE) IMPLANT
SUT VIC AB 0 CT1 36 (SUTURE) ×12 IMPLANT
SUT VIC AB 2-0 CT1 36 (SUTURE) ×6 IMPLANT
SUT VIC AB 2-0 CT2 27 (SUTURE) ×20 IMPLANT
SUT VIC AB 2-0 SH 27 (SUTURE) ×9
SUT VIC AB 2-0 SH 27X BRD (SUTURE) ×5 IMPLANT
SUT VIC AB 3-0 CTX 36 (SUTURE) IMPLANT
SUT VIC AB 3-0 SH 27 (SUTURE) ×9
SUT VIC AB 3-0 SH 27X BRD (SUTURE) ×4 IMPLANT
SUT VICRYL 2 0 18  UND BR (SUTURE) ×1
SUT VICRYL 2 0 18 UND BR (SUTURE) ×2 IMPLANT
SYR 20CC LL (SYRINGE) ×6 IMPLANT
TOWEL OR 17X26 10 PK STRL BLUE (TOWEL DISPOSABLE) ×6 IMPLANT
TOWEL OR NON WOVEN STRL DISP B (DISPOSABLE) ×3 IMPLANT
TRAY FOLEY W/METER SILVER 14FR (SET/KITS/TRAYS/PACK) ×3 IMPLANT
TRAY FOLEY W/METER SILVER 16FR (SET/KITS/TRAYS/PACK) ×3 IMPLANT
WATER STERILE IRR 1500ML POUR (IV SOLUTION) ×1 IMPLANT

## 2015-10-17 NOTE — H&P (View-Only) (Signed)
Followup Note: Gyn-Onc  Consult was initially requested inpatient by Dr. Earlie Counts on 07/31/15 for the evaluation of Renee Oconnor 45 y.o. female With a 15cm pelvic/abdominal mass  CC:  Chief Complaint  Patient presents with  . pelvic mass    Pre-Op   Assessment/Plan:  Ms. AUDRIS DELZELL is a 45 y.o. 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  She is improving from her infectious process. Her colonoscopy on 10/09/15 was unremarkable.  I have personally reviewed her CT images. I have a low suspicion for this representing a malignancy. However, due to its size, I recommend surgical removal. We again discussed possible complications of GI involvement/adhesions requiring GI resection and possibly colostomy. I discussed risks of wound infection, VTE, prolonged hospitalization.  She is scheduled for a TAH, LSO, possible staging, possible bowel resection on 10/16/14.  Mag citrate mechanical bowel prep preop.  HPI: Araoluwa Ninh is a 45 year old woman who was seen in consultation at the request of Dr Wyline Copas for a large ovarian mass.  The patient was aditted on 07/28/15 for presumed colitis due to CT findings, fevers, and GI symptoms.  CT of the abdomen and pelvis on 07/28/15 showed : There is a large multi-septated cystic mass arising from the pelvis which measure 14.8 x 14.4 x 13.3 cm. It is difficult to determine exact origin of this tumor, but most likely is ovarian in etiology. There also appears to be inflammation involving a portion of the sigmoid colon that runs superior to this mass. Adjacent to this inflamed bowel loop, there is noted 3.1 x 2.3 cm low density which may represent adjacent abscess..  The patient reported symptoms prior to admission of 2 weeks of epigastric and mid abdominal pain, intermittent nausea and emesis and intermittent obstipation.  She has a remote hx in 2006 of  an ex lap, myomectomy and colonic resection with reanastamosis with Dr Raphael Gibney. This was reversed 6 months later.   She has no family hx concerning from breast/ovarian cancer syndrome.  She was treated with IV antibiotics and then discharged when she was clinically improved.   Interval Hx: She denies fevers or chills or pain. She had a colonoscopy on 10/09/15 at [facility] which, per patient, was normal.  Current Outpatient Prescriptions on File Prior to Visit  Medication Sig Dispense Refill  . aspirin-acetaminophen-caffeine (EXCEDRIN MIGRAINE) 250-250-65 MG tablet Take 1 tablet by mouth every 6 (six) hours as needed for headache.    . carvedilol (COREG) 3.125 MG tablet Take 1.56 mg by mouth 2 (two) times daily with a meal.     . docusate sodium (COLACE) 100 MG capsule Take 1 capsule (100 mg total) by mouth 2 (two) times daily. 30 capsule 0  . ferrous sulfate 325 (65 FE) MG tablet Take 1 tablet (325 mg total) by mouth 2 (two) times daily with a meal. 30 tablet 2  . hydrochlorothiazide (HYDRODIURIL) 50 MG tablet Take 25 mg by mouth daily.    . polyethylene glycol (MIRALAX) packet Take 17 g by mouth daily. 14 each 0  . ketorolac (TORADOL) 10 MG tablet Take 1 tablet (10 mg total) by mouth every 6 (six) hours as needed. (Patient not taking: Reported on 10/10/2015) 20 tablet 0  . [DISCONTINUED] amLODipine-valsartan (EXFORGE) 10-160 MG per tablet Take 1 tablet by mouth daily.     No current facility-administered medications on file prior to visit.  No Known Allergies Past Medical History  Diagnosis Date  . Hypertension   . Diabetes mellitus   . High cholesterol    Past Surgical History  Procedure Laterality Date  . Ectopic pregnancy surgery    . Bowel resection       Past Gynecological History: premenopausal Patient's last menstrual period was 07/19/2015.  Family Hx:  Family History  Problem Relation Age of Onset  . Family history unknown: Yes   Social History    Social History  . Marital Status: Married    Spouse Name: N/A  . Number of Children: N/A  . Years of Education: N/A   Occupational History  . Not on file.   Social History Main Topics  . Smoking status: Never Smoker   . Smokeless tobacco: Never Used  . Alcohol Use: No  . Drug Use: No  . Sexual Activity: Not Currently   Other Topics Concern  . Not on file   Social History Narrative     Review of Systems:  Constitutional  Feels well,  ENT Normal appearing ears and nares bilaterally Skin/Breast  No rash, sores, jaundice, itching, dryness Cardiovascular  No chest pain, shortness of breath, or edema  Pulmonary  No cough or wheeze.  Gastro Intestinal  See HPI Genito Urinary  No frequency, urgency, dysuria,  Musculo Skeletal  No myalgia, arthralgia, joint swelling or pain  Neurologic  No weakness, numbness, change in gait,  Psychology  No depression, anxiety, insomnia.   Vitals: BP 173/99 mmHg  Pulse 82  Temp(Src) 99.2 F (37.3 C) (Oral)  Resp 20  Wt 258 lb 14.4 oz (117.436 kg)  SpO2 100%.  Physical Exam: WD in NAD Neck  Supple NROM, without any enlargements.  Lymph Node Survey No cervical supraclavicular or inguinal adenopathy Skin  No rash/lesions/breakdown  Psychiatry  Alert and oriented to person, place, and time  Abdomen  Normoactive bowel sounds, abdomen soft, non-tender and obese without evidence of hernia. Mass palpable in mid abdomen. Back No CVA tenderness Genito Urinary:   Normal external female genitalia. Vagina: normal. Cervix: normal in appearance and palpation. Uterus: slightly bulky, mobile. Adnexa: Large, solid and cystic mass arising from posterior to uterus and into lower abdomen. Rectal  Soft, no palpable transmural lesions. Pelvic mass appreciated on rectal exam. Extremities  No bilateral cyanosis, clubbing or edema.     Donaciano Eva, MD

## 2015-10-17 NOTE — Transfer of Care (Signed)
Immediate Anesthesia Transfer of Care Note  Patient: MALKY LECCESE  Procedure(s) Performed: Procedure(s): EXPLORATORY LAPAROTOMY  (N/A) SUPRACERVICAL  HYSTERECTOMY bILATERAL SALPINGO OOPHORECTOMY,SMALL BOWEL RESECTION, LYSIS OF ADHESIONS  (Left)  Patient Location: PACU  Anesthesia Type:General  Level of Consciousness:  sedated, patient cooperative and responds to stimulation  Airway & Oxygen Therapy:Patient Spontanous Breathing and Patient connected to face mask oxgen  Post-op Assessment:  Report given to PACU RN and Post -op Vital signs reviewed and stable  Post vital signs:  Reviewed and stable  Last Vitals:  Filed Vitals:   10/17/15 1456 10/17/15 1500  BP: 147/88 152/97  Pulse: 77 70  Temp: 36.9 C   Resp: 13 16    Complications: No apparent anesthesia complications

## 2015-10-17 NOTE — Progress Notes (Signed)
Assumed care of pt from [place]

## 2015-10-17 NOTE — Op Note (Signed)
OPERATIVE NOTE  PATIENT: Renee Oconnor DATE: 10/17/15   Preop Diagnosis: pelvic mass, morbid obesity  Postoperative Diagnosis: Same  Surgery: Ex lap, Supracervical hysterectomy, bilateral salpingo-oophorectomy, lysis of adhesions, ileal resection with functional side to side anastamosis.  Surgeons:  Everitt Amber, MD; Lahoma Crocker, MD (an MD assistant was necessary for tissue manipulation, retraction and positioning due to the complexity of the case and hospital policies).   Anesthesia: General   Anesthesiologist:   Estimated blood loss: 983ml  IVF: 5000 ml   PRBC: 2 units  Urine output:  123XX123 ml   Complications: None   Pathology: Uterus, bilateral tubes and ovaries, ileum  Operative findings: Very dense adhesions between omentum and anterior abdominal wall and sigmoid colon and cecum and pelvic mass. 10cm complex left ovarian mass (retroperitoneal) with dense adhesions to mid ileum and sigmoid and cecum. Ovary containing pus (ruptured intraoperatively).   Cervix grossly normal, however there is a 1.5cm firm nodular area in the rectovaginal septum (preop colonoscopy normal). Densely adherent to rectal wall anteriorally and vagina/posterior cervix.   Benign inflamed ovary on frozen section.  Procedure: The patient was identified in the preoperative holding area. Informed consent was signed on the chart. Patient was seen history was reviewed and exam was performed.   The patient was then taken to the operating room and placed in the supine position with SCD hose on. General anesthesia was then induced without difficulty. She was then placed in the dorsolithotomy position. The abdomen was prepped with chlor prep sponges per protocol. Perineum was prepped with Betadine. The vagina was prepped with Betadine a Foley catheter was inserted into the bladder under sterile conditions.  The patient was then draped after the prep was dried. Timeout was performed the  patient, procedure, antibiotic, allergy, and length of procedure. A vertical midline infraumbilical incision was and carried down to the underlying fascia using Bovie cautery. The fascia was scored in the fascial incision was extended superiorly and inferiorly using Bovie cautery. The rectus bellies were dissected off the overlying fascia. The peritoneum was tented and entered sharply. The peritoneal incision was extended superiorly and inferiorly with visualization of the underlying peritoneal cavity. The Buchwalter self-retaining retractor was then placed. At the initial placement as well as at several points during the case the lateral blades were checked to ensure no significant pressure on the psoas bellies.  For approximately 90 minutes extensive sharp (with metzenbaums) lysis of adhesions took place to separate the adhesions between the small intestine and the abdominal wall, omentum, cecum and sigmoid colon and left ovarian mass. The left ovarian mass was retroperitoneal, and firm and very dense. It was minimally mobile. It was extremely inflamed.   To gain additional mobility, the round ligament on the patient's right side was transected with monopolar cautery the anterior posterior leaves the broad ligament were opened. A window was made between the infundibulopelvic vessels and the ureter. The vessels were clamped x2 transected and ligasure sealed and ligated.   The left retroperitoneal space was then opened with the bovie, the left ovary was mobilized medially and in doing so the left ureter was identified in the retroperitoneum. This enabled the left ovarian vessels to be skeletonized free from the ureter and the sigmoid colon. This vascular pedicle was then sealed and transected using the ligasure.  The bladder flap was created at this point with the bovie and metzenbaum scissors. There was considerable dense inflamed peritoneal tissue anteriorally. The posterior peritoneum was then sharply  dissected free from the rectum and sigmoid colon. It was not possible to separate the distal rectum from the cervix at the level of the pelvic floor due to the presence ofa 1.5cm midline prerectal mass adherent to the posterior vagina/within the septum.   The uterine vessels on the left and right side were skeletonized with care to observe the ureter bilaterally. The uterine vessels were clamped with curved Masterson clamps transected and suture ligated. Because it was not possible to perform a total hysterectomy without performing a rectal resection, a supracervical hysterectomy was performed by amputating the specimen at the uterine isthmus above the level of the uterine arteries.   The uterine specimen and bilateral ovaries was sent for frozen section which was benign with inflammed tissue.  Because the pathology was benign and because the patient has a history of a prior bowel resection and colostomy and concern for duplicating a low rectal anastamosis and compromising blood flow to the anastamosis (necessitating a permanent colostomy), a decision was made to not remove the cervical stump and nodule at the rectovaginal septum.The cervical stump was closed using figure-of-eight sutures of 0 Vicryl.   Close inspection took place along the entirity of the bowel. Two areas of inflammed thinned bowel were noted in the mid ileum approximately 30cm appart. They were resected enbloc in order to avoid a duplicated anastamosis in close proximity. The segment of ileum was selected. A window was created at the junction of the bowel wall and mesentery. The bowel stapler was passed through the window and and the 68mm GIA stapler was deployed and fired. At the distal margin a similar process took place. The mesentery was scored then sealed and transected with ligasure to free up the sgment of ileum. The corners of the bowel ends were opened. The GIA stapler was passed into these limbs, deployed and fired to create a  stapled functional side-to-side anastamosis. The opening of the two limbs was reapproximated with allis clamps. A 33mm TA stapler was used to seal the top of the anastamosis with a double layer of staples. The mesenteric window was closed with 3-0vicryl interrupted.  The abdomen pelvis were copiously irrigated with 3 L of saline. The retractor and laparotomy sponges were removed. The fascia was closed using running mass closure of #1 PDS. The subcutaneous tissues were irrigated and made hemostatic. 20 mL of Exparel within 180 mL of normal saline was injected for postoperative pain control. The skin was closed using staples.  All instrument, suture, laparotomy, Ray-Tec, and needle counts were correct x2. The patient tolerated the procedure well and was taken recovery room in stable condition. This is Everitt Amber dictating an operative note on Renee Oconnor.  Donaciano Eva, MD

## 2015-10-17 NOTE — Anesthesia Preprocedure Evaluation (Signed)
Anesthesia Evaluation  Patient identified by MRN, date of birth, ID band Patient awake    Reviewed: Allergy & Precautions, NPO status , Patient's Chart, lab work & pertinent test results  History of Anesthesia Complications Negative for: history of anesthetic complications  Airway Mallampati: III  TM Distance: >3 FB Neck ROM: Full    Dental no notable dental hx. (+) Dental Advisory Given   Pulmonary neg pulmonary ROS,    Pulmonary exam normal breath sounds clear to auscultation       Cardiovascular hypertension, Pt. on medications Normal cardiovascular exam Rhythm:Regular Rate:Normal     Neuro/Psych negative neurological ROS  negative psych ROS   GI/Hepatic negative GI ROS, Neg liver ROS,   Endo/Other  Morbid obesity  Renal/GU negative Renal ROS  negative genitourinary   Musculoskeletal negative musculoskeletal ROS (+)   Abdominal   Peds negative pediatric ROS (+)  Hematology negative hematology ROS (+)   Anesthesia Other Findings   Reproductive/Obstetrics negative OB ROS                             Anesthesia Physical Anesthesia Plan  ASA: III  Anesthesia Plan: General   Post-op Pain Management:    Induction: Intravenous  Airway Management Planned: Oral ETT  Additional Equipment:   Intra-op Plan:   Post-operative Plan: Extubation in OR  Informed Consent: I have reviewed the patients History and Physical, chart, labs and discussed the procedure including the risks, benefits and alternatives for the proposed anesthesia with the patient or authorized representative who has indicated his/her understanding and acceptance.   Dental advisory given  Plan Discussed with: CRNA  Anesthesia Plan Comments:         Anesthesia Quick Evaluation

## 2015-10-17 NOTE — Interval H&P Note (Signed)
History and Physical Interval Note:  10/17/2015 11:03 AM  [name]  has presented today for surgery, with the diagnosis of PELVIC MASS   The various methods of treatment have been discussed with the patient and family. After consideration of risks, benefits and other options for treatment, the patient has consented to  Procedure(s): EXPLORATORY LAPAROTOMY  (N/A) TOTAL ABDOMINAL HYSTERECTOMY LEFT SALPINGO OOPHORECTOMY POSSIBLE STAGING  (Left) as a surgical intervention .  The patient's history has been reviewed, patient examined, no change in status, stable for surgery.  I have reviewed the patient's chart and labs.  Questions were answered to the patient's satisfaction.     Renee Oconnor

## 2015-10-17 NOTE — Anesthesia Postprocedure Evaluation (Signed)
Anesthesia Post Note  Patient: Renee Oconnor  Procedure(s) Performed: Procedure(s) (LRB): EXPLORATORY LAPAROTOMY  (N/A) SUPRACERVICAL  HYSTERECTOMY bILATERAL SALPINGO OOPHORECTOMY,SMALL BOWEL RESECTION, LYSIS OF ADHESIONS  (Left)  Patient location during evaluation: PACU Anesthesia Type: General Level of consciousness: sedated Pain management: pain level controlled Vital Signs Assessment: post-procedure vital signs reviewed and stable Respiratory status: spontaneous breathing and respiratory function stable Cardiovascular status: stable Anesthetic complications: no    Last Vitals:  Filed Vitals:   10/17/15 1600 10/17/15 1615  BP: 110/69 107/77  Pulse: 65 67  Temp:    Resp: 10 15    Last Pain:  Filed Vitals:   10/17/15 1625  PainSc: 8                  [name] DANIEL

## 2015-10-17 NOTE — Anesthesia Procedure Notes (Addendum)
Procedure Name: Intubation Date/Time: 10/17/2015 11:22 AM Performed by: Montel Clock Pre-anesthesia Checklist: Patient identified, Emergency Drugs available, Suction available, Patient being monitored and Timeout performed Patient Re-evaluated:Patient Re-evaluated prior to inductionOxygen Delivery Method: Circle system utilized Preoxygenation: Pre-oxygenation with 100% oxygen Intubation Type: IV induction Ventilation: Mask ventilation without difficulty and Oral airway inserted - appropriate to patient size Laryngoscope Size: Mac and 3 Grade View: Grade I Tube type: Oral Tube size: 7.5 mm Number of attempts: 1 Airway Equipment and Method: Stylet Placement Confirmation: ETT inserted through vocal cords under direct vision,  positive ETCO2 and breath sounds checked- equal and bilateral Secured at: 21 cm Tube secured with: Tape Dental Injury: Teeth and Oropharynx as per pre-operative assessment  Comments: L front upper tooth chip was present preoperatively, poor dentition.

## 2015-10-18 ENCOUNTER — Encounter (HOSPITAL_COMMUNITY): Payer: Self-pay | Admitting: Gynecologic Oncology

## 2015-10-18 LAB — BASIC METABOLIC PANEL
ANION GAP: 10 (ref 5–15)
BUN: 15 mg/dL (ref 6–20)
CALCIUM: 9 mg/dL (ref 8.9–10.3)
CHLORIDE: 103 mmol/L (ref 101–111)
CO2: 23 mmol/L (ref 22–32)
CREATININE: 0.72 mg/dL (ref 0.44–1.00)
GFR calc non Af Amer: >60 mL/min (ref >60)
Glucose, Bld: 126 mg/dL — ABNORMAL HIGH (ref 65–99)
Potassium: 4.5 mmol/L (ref 3.5–5.1)
SODIUM: 136 mmol/L (ref 135–145)

## 2015-10-18 LAB — TYPE AND SCREEN
ABO/RH(D): A POS
ANTIBODY SCREEN: NEGATIVE
Unit division: 0
Unit division: 0

## 2015-10-18 LAB — CBC
HEMATOCRIT: 36.3 % (ref 36.0–46.0)
HEMOGLOBIN: 11 g/dL — AB (ref 12.0–15.0)
MCH: 24.3 pg — ABNORMAL LOW (ref 26.0–34.0)
MCHC: 30.3 g/dL (ref 30.0–36.0)
MCV: 80.3 fL (ref 78.0–100.0)
Platelets: 347 10*3/uL (ref 150–400)
RBC: 4.52 MIL/uL (ref 3.87–5.11)
RDW: 19.3 % — AB (ref 11.5–15.5)
WBC: 11.5 10*3/uL — ABNORMAL HIGH (ref 4.0–10.5)

## 2015-10-18 NOTE — Progress Notes (Signed)
1 Day Post-Op Procedure(s) (LRB): EXPLORATORY LAPAROTOMY  (N/A) SUPRACERVICAL  HYSTERECTOMY bILATERAL SALPINGO OOPHORECTOMY,SMALL BOWEL RESECTION, LYSIS OF ADHESIONS  (Left)  Subjective: Patient reports tolerating diet but "feeling like my stomach is going to bust."  Tolerated eggs and bacon this am.  Minimal pain reported.  Up with assistance.  Ambulating without difficulty.  Denies nausea or emesis but reporting feeling "full".  Denies chest pain, dyspnea, passing flatus, or having a bowel movement.  No concerns voiced.    Objective: Vital signs in last 24 hours: Temp:  [97.2 F (36.2 C)-98.4 F (36.9 C)] 97.8 F (36.6 C) (01/18 1400) Pulse Rate:  [57-113] 113 (01/18 1400) Resp:  [7-19] 16 (01/18 1400) BP: (107-155)/(69-98) 130/93 mmHg (01/18 1400) SpO2:  [98 %-100 %] 99 % (01/18 1400) Last BM Date: 10/16/15  Intake/Output from previous day: 01/17 0701 - 01/18 0700 In: 5237.3 [I.V.:4573.3; Blood:664] Out: 1750 [Urine:850; Blood:900]  Physical Examination: General: alert, cooperative and no distress Resp: clear to auscultation bilaterally Cardio: regular rate and rhythm, S1, S2 normal, no murmur, click, rub or gallop GI: incision: midline incision with honeycomb dressing intact, no erythema or active drainage noted and abdomen obese, soft, hypoactive bowel sounds Extremities: extremities normal, atraumatic, no cyanosis or edema  Labs: WBC/Hgb/Hct/Plts:  11.5/11.0/36.3/347 (01/18 0505) BUN/Cr/glu/ALT/AST/amyl/lip:  15/0.72/--/--/--/--/-- (01/18 0505)  Assessment: 45 y.o. s/p Procedure(s): EXPLORATORY LAPAROTOMY  SUPRACERVICAL  HYSTERECTOMY bILATERAL SALPINGO OOPHORECTOMY,SMALL BOWEL RESECTION, LYSIS OF ADHESIONS : stable Pain:  Pain is well-controlled on PRN medications.  Heme: Hgb 11.0 and Hct 36.3 this am.  Stable post-operatively.  CV: BP and HR stable post-operatively.  Continue to monitor with ordered vital signs.  GI:  Tolerating po: Yes.  Antiemetics ordered  PRN.  GU: Adequate output reported. Due to void since foley removal.   FEN: Stable post-operatively.  Prophylaxis: pharmacologic prophylaxis (with any of the following: enoxaparin (Lovenox) 40mg  SQ 2 hours prior to surgery then every day) and intermittent pneumatic compression boots.  Plan: Diet as tolerated.  Advised to eat slowly and in small amounts Saline lock if diet tolerated with no nausea or emesis Encourage ambulation, IS use, deep breathing, and coughing Continue post-operative plan of care per Dr. Denman George    LOS: 1 day    Renee Oconnor DEAL 10/18/2015, 2:31 PM

## 2015-10-18 NOTE — Progress Notes (Signed)
1 Day Post-Op Procedure(s) (LRB): EXPLORATORY LAPAROTOMY  (N/A) SUPRACERVICAL  HYSTERECTOMY bILATERAL SALPINGO OOPHORECTOMY,SMALL BOWEL RESECTION, LYSIS OF ADHESIONS  (Left) For tubo-ovarian abscesses  Subjective: Patient reports distension. Tolerating PO. No flatus.    Objective: Vital signs in last 24 hours: Temp:  [97.2 F (36.2 C)-98.4 F (36.9 C)] 98.4 F (36.9 C) (01/18 0518) Pulse Rate:  [57-101] 101 (01/18 0518) Resp:  [7-19] 16 (01/18 0518) BP: (107-156)/(69-98) 133/83 mmHg (01/18 0518) SpO2:  [99 %-100 %] 99 % (01/18 0518) Weight:  [255 lb (115.667 kg)] 255 lb (115.667 kg) (01/17 0834) Last BM Date: 10/16/15  Intake/Output from previous day: 01/17 0701 - 01/18 0700 In: 5237.3 [I.V.:4573.3; Blood:664] Out: 1750 [Urine:850; Blood:900]  Physical Examination: General: alert and cooperative Resp: clear to auscultation bilaterally GI: soft, non-tender; bowel sounds normal; no masses,  no organomegaly and distended Extremities: extremities normal, atraumatic, no cyanosis or edema  Labs: WBC/Hgb/Hct/Plts:  11.5/11.0/36.3/347 (01/18 0505) BUN/Cr/glu/ALT/AST/amyl/lip:  15/0.72/--/--/--/--/-- (01/18 0505)   Assessment:  45 y.o. s/p Procedure(s): EXPLORATORY LAPAROTOMY  SUPRACERVICAL  HYSTERECTOMY bILATERAL SALPINGO OOPHORECTOMY,SMALL BOWEL RESECTION, LYSIS OF ADHESIONS : stable Pain:  Pain is well-controlled on oral medications.  Heme:Anemia: appropriate Hb after 2 unit transfusion for intraoperative hemorrhage.   GU: marginal UO, but normal creatinine. Will avoid boluses as this can lead to bowel wall edema and ileus. Will restart IVF if unable to tolerate PO  CV: hemodynamically stable  .  GI:  Tolerating po: Yes     FEN: continue IVF at low rate today.  Prophylaxis: pharmacologic prophylaxis (with any of the following: enoxaparin (Lovenox) 40mg  SQ 2 hours prior to surgery then every day).  Plan: Encourage ambulation encouraged to go easy on oral diet while  feeling distended.  Foley out and voiding trial. Dispo:  Discharge plan to include :discharge to home The patient is to be discharged to home, anticipate POD3.   LOS: 1 day    Renee Oconnor 10/18/2015, 8:03 AM

## 2015-10-19 LAB — URINE MICROSCOPIC-ADD ON

## 2015-10-19 LAB — URINALYSIS, ROUTINE W REFLEX MICROSCOPIC
BILIRUBIN URINE: NEGATIVE
GLUCOSE, UA: NEGATIVE mg/dL
Ketones, ur: NEGATIVE mg/dL
Leukocytes, UA: NEGATIVE
Nitrite: NEGATIVE
PROTEIN: 100 mg/dL — AB
Specific Gravity, Urine: >1.046 — ABNORMAL HIGH (ref 1.005–1.030)
pH: 5.5 (ref 5.0–8.0)

## 2015-10-19 MED ORDER — SODIUM CHLORIDE 0.9 % IV BOLUS (SEPSIS)
500.0000 mL | Freq: Once | INTRAVENOUS | Status: AC
  Administered 2015-10-19: 500 mL via INTRAVENOUS

## 2015-10-19 MED ORDER — DOCUSATE SODIUM 100 MG PO CAPS
100.0000 mg | ORAL_CAPSULE | Freq: Every day | ORAL | Status: DC
  Administered 2015-10-19 – 2015-10-21 (×3): 100 mg via ORAL
  Filled 2015-10-19 (×3): qty 1

## 2015-10-19 NOTE — Progress Notes (Signed)
2 Days Post-Op Procedure(s) (LRB): EXPLORATORY LAPAROTOMY  (N/A) SUPRACERVICAL  HYSTERECTOMY bILATERAL SALPINGO OOPHORECTOMY,SMALL BOWEL RESECTION, LYSIS OF ADHESIONS  (Left)  Subjective: Patient reports tolerating diet with no nausea or emesis.  Minimal pain reported.  Up with assistance.  Ambulating without difficulty.  Reporting decreased in "full" feeling.  Voiding without difficulty.  Denies chest pain, dyspnea, passing flatus, or having a bowel movement.  No concerns voiced.    Objective: Vital signs in last 24 hours: Temp:  [97.4 F (36.3 C)-99 F (37.2 C)] 97.4 F (36.3 C) (01/19 0657) Pulse Rate:  [106-113] 106 (01/19 0657) Resp:  [16] 16 (01/19 0657) BP: (120-152)/(64-93) 152/87 mmHg (01/19 0657) SpO2:  [97 %-99 %] 98 % (01/19 0657) Last BM Date: 10/16/15  Intake/Output from previous day: 01/18 0701 - 01/19 0700 In: 1800 [P.O.:600; I.V.:1200] Out: 700 [Urine:700]  Physical Examination: General: alert, cooperative and no distress Resp: clear to auscultation bilaterally Cardio: regular rate and rhythm, S1, S2 normal, no murmur, click, rub or gallop GI: incision: midline incision with honeycomb dressing intact, no erythema or active drainage noted and abdomen obese, soft, hypoactive bowel sounds Extremities: extremities normal, atraumatic, no cyanosis or edema Abdomen tympanic on percussion  Assessment: 45 y.o. s/p Procedure(s): EXPLORATORY LAPAROTOMY  SUPRACERVICAL  HYSTERECTOMY bILATERAL SALPINGO OOPHORECTOMY,SMALL BOWEL RESECTION, LYSIS OF ADHESIONS : stable Pain:  Pain is well-controlled on PRN medications.  Heme: Hgb 11.0 and Hct 36.3 10/18/15 am.  Stable post-operatively.  CV: BP and HR stable post-operatively.  Continue to monitor with ordered vital signs.  GI:  Tolerating po: Yes.  Antiemetics ordered PRN.  GU: Adequate output reported.   FEN: Stable post-operatively.  Prophylaxis: pharmacologic prophylaxis (with any of the following: enoxaparin (Lovenox)  40mg  SQ 2 hours prior to surgery then every day) and intermittent pneumatic compression boots.  Plan: Diet as tolerated.  Advised to eat slowly and in small amounts 500 cc normal saline bolus due to mild tachycardia.  IV at 50 after bolus with possible saline lock if tachycardia improving. Colace daily Encourage ambulation, IS use, deep breathing, and coughing Continue post-operative plan of care per Dr. Delsa Sale    LOS: 2 days    CROSS, MELISSA DEAL 10/19/2015, 11:14 AM

## 2015-10-20 LAB — BASIC METABOLIC PANEL
ANION GAP: 8 (ref 5–15)
BUN: 15 mg/dL (ref 6–20)
CHLORIDE: 103 mmol/L (ref 101–111)
CO2: 24 mmol/L (ref 22–32)
Calcium: 9.6 mg/dL (ref 8.9–10.3)
Creatinine, Ser: 0.65 mg/dL (ref 0.44–1.00)
GFR calc Af Amer: >60 mL/min (ref >60)
Glucose, Bld: 115 mg/dL — ABNORMAL HIGH (ref 65–99)
POTASSIUM: 4.6 mmol/L (ref 3.5–5.1)
SODIUM: 135 mmol/L (ref 135–145)

## 2015-10-20 LAB — CBC WITH DIFFERENTIAL/PLATELET
BASOS ABS: 0 10*3/uL (ref 0.0–0.1)
Basophils Relative: 0 %
EOS ABS: 0.2 10*3/uL (ref 0.0–0.7)
EOS PCT: 2 %
HCT: 35.8 % — ABNORMAL LOW (ref 36.0–46.0)
HEMOGLOBIN: 10.4 g/dL — AB (ref 12.0–15.0)
LYMPHS ABS: 1.4 10*3/uL (ref 0.7–4.0)
LYMPHS PCT: 15 %
MCH: 24.3 pg — AB (ref 26.0–34.0)
MCHC: 29.1 g/dL — ABNORMAL LOW (ref 30.0–36.0)
MCV: 83.6 fL (ref 78.0–100.0)
Monocytes Absolute: 0.8 10*3/uL (ref 0.1–1.0)
Monocytes Relative: 8 %
NEUTROS PCT: 75 %
Neutro Abs: 6.9 10*3/uL (ref 1.7–7.7)
PLATELETS: 399 10*3/uL (ref 150–400)
RBC: 4.28 MIL/uL (ref 3.87–5.11)
RDW: 19.7 % — ABNORMAL HIGH (ref 11.5–15.5)
WBC: 9.2 10*3/uL (ref 4.0–10.5)

## 2015-10-20 LAB — MAGNESIUM: MAGNESIUM: 2.2 mg/dL (ref 1.7–2.4)

## 2015-10-20 MED ORDER — SODIUM CHLORIDE 0.9 % IV BOLUS (SEPSIS)
500.0000 mL | Freq: Once | INTRAVENOUS | Status: AC
  Administered 2015-10-20: 500 mL via INTRAVENOUS

## 2015-10-20 NOTE — Progress Notes (Signed)
3 Days Post-Op Procedure(s) (LRB): EXPLORATORY LAPAROTOMY  (N/A) SUPRACERVICAL  HYSTERECTOMY bILATERAL SALPINGO OOPHORECTOMY,SMALL BOWEL RESECTION, LYSIS OF ADHESIONS  (Left)  Subjective: Patient reports episode of emesis early this am with feelings of relief after.  No nausea reported this am.  Minimal pain reported.  Up with assistance.  Tolerating sips of clears.  Ambulating without difficulty.  Voiding without difficulty.  Denies chest pain, dyspnea, passing flatus, or having a bowel movement.  No concerns voiced.    Objective: Vital signs in last 24 hours: Temp:  [98.2 F (36.8 C)-98.7 F (37.1 C)] 98.6 F (37 C) (01/20 0523) Pulse Rate:  [93-103] 103 (01/20 0523) Resp:  [18] 18 (01/20 0523) BP: (140-168)/(79-101) 168/95 mmHg (01/20 0523) SpO2:  [100 %] 100 % (01/20 0523) Last BM Date: 10/16/15  Intake/Output from previous day: 01/19 0701 - 01/20 0700 In: 1480 [P.O.:480; I.V.:1000] Out: 600 [Urine:600]  Physical Examination: General: alert, cooperative and no distress Resp: clear to auscultation bilaterally Cardio: regular rate and rhythm, S1, S2 normal, no murmur, click, rub or gallop GI: incision: midline incision with honeycomb dressing intact, no erythema or active drainage noted and abdomen obese, soft, hypoactive bowel sounds Extremities: extremities normal, atraumatic, no cyanosis or edema Abdomen tympanic on percussion  Assessment: 45 y.o. s/p Procedure(s): EXPLORATORY LAPAROTOMY  SUPRACERVICAL  HYSTERECTOMY bILATERAL SALPINGO OOPHORECTOMY,SMALL BOWEL RESECTION, LYSIS OF ADHESIONS : stable Pain:  Pain is well-controlled on PRN medications.  Heme: Hgb 11.0 and Hct 36.3 10/18/15 am.  Stable post-operatively.  CV: BP and HR stable post-operatively.  Continue to monitor with ordered vital signs.  GI:  Tolerating po: Yes but episode of emesis reported.  Probable post-operative ileus.  Antiemetics ordered PRN.  GU: Adequate output reported.   FEN: Stable  post-operatively.  Prophylaxis: pharmacologic prophylaxis (with any of the following: enoxaparin (Lovenox) 40mg  SQ 2 hours prior to surgery then every day) and intermittent pneumatic compression boots.  Plan: IV to 75 cc Sips of clears as tolerated CBC, Bmet, Mag today Plan for KUB of the abdomen if nausea/emesis/no flatus this afternoon Encourage ambulation, IS use, deep breathing, and coughing Continue post-operative plan of care per Dr. Delsa Sale    LOS: 3 days    Indi Willhite DEAL 10/20/2015, 10:49 AM

## 2015-10-20 NOTE — Progress Notes (Signed)
Patient ambulating in the halls without difficulty.  Stating she has passed flatus and had two bowel movements.  Minimal pain reported.  "Doing well."  Stating her husband is going out to get her some wonton soup for dinner.  Advised since she is having flatus and bowel movements, we will advance her diet back to regular.  No concerns voiced.    Per Dr. Delsa Sale, 500cc NS bolus now and increase IVF to 100 cc/hr since she is on the drier side (specific gravity >1.046).  RN notified of plan.

## 2015-10-21 MED ORDER — ACETAMINOPHEN 500 MG PO TABS: 1000.0000 mg | ORAL_TABLET | Freq: Four times a day (QID) | ORAL | Status: DC

## 2015-10-21 MED ORDER — DOCUSATE SODIUM 100 MG PO CAPS: 100.0000 mg | ORAL_CAPSULE | Freq: Every day | ORAL | Status: DC

## 2015-10-21 MED ORDER — ENSURE ENLIVE PO LIQD: 237.0000 mL | Freq: Two times a day (BID) | ORAL | Status: DC

## 2015-10-21 MED ORDER — OXYCODONE HCL 5 MG PO TABS: 5.0000 mg | ORAL_TABLET | ORAL | Status: DC | PRN

## 2015-10-21 NOTE — Progress Notes (Signed)
Assessment unchanged. Pt verbalized understanding of discharge instructions through teach back regarding follow up care and when to call the doctor. Discharged via wc to front entrance to meet awaiting vehicle to carry home. Accompanied by husband and NT.

## 2015-10-21 NOTE — Discharge Instructions (Signed)
°  10/21/2015  Return to work: 6 weeks  Activity: 1. Be up and out of the bed during the day.  Take a nap if needed.  You may walk up steps but be careful and use the hand rail.  Stair climbing will tire you more than you think, you may need to stop part way and rest.   2. No lifting or straining for 6 weeks.  3. No driving for 2 weeks.  Do Not drive if you are taking narcotic pain medicine.  4. Shower daily.  Use soap and water on your incision and pat dry; don't rub.   5. No sexual activity and nothing in the vagina for 6 weeks.  Diet: 1. Low sodium Heart Healthy Diet is recommended.  2. It is safe to use a laxative if you have difficulty moving your bowels.   Wound Care: 1. Keep clean and dry.  Shower daily.  Reasons to call the Doctor:   Fever - Oral temperature greater than 100.4 degrees Fahrenheit  Foul-smelling vaginal discharge  Difficulty urinating  Nausea and vomiting  Increased pain at the site of the incision that is unrelieved with pain medicine.  Difficulty breathing with or without chest pain  New calf pain especially if only on one side  Sudden, continuing increased vaginal bleeding with or without clots.  Try carnation instant breakfast or ensure drinks to maximize healing postop.   Follow-up: 1. See Everitt Amber in 3-4 days for staple removal and then again in 3 weeks for postop care.  Contacts: For questions or concerns you should contact:  Dr. Everitt Amber at [phone]  or at [place]

## 2015-10-21 NOTE — Discharge Summary (Signed)
Physician Discharge Summary  Patient ID: Renee Oconnor MRN: [id number] DOB/AGE: [birth year] 45 y.o.  Admit date: 10/17/2015 Discharge date: 10/21/2015  Admission Diagnoses: Ovarian mass  Discharge Diagnoses:  Principal Problem:   Ovarian mass Active Problems:   Pelvic mass in female   Discharged Condition: good  Hospital Course: patient was admitted on 10/17/15 for TAH, BSO, for a pelvic mass. Surgery involved extensive adhesiolysis. She was found to have a large left tubo-ovarian abscess. She underwent a supracervical hysterectomy, BSO and lysis of adhesions. EBL was 900cc and she required 2 units of PRBC intraop.  Postop her Hb was stable and she required no additional transfusions.  She developed postoperative delay in bowel function causing emesis on POD 3. However, she began having flatus and stools later that day, and on POD 4 was meeting d.c. Criteria, tolerated a regular diet with pain well controlled.  Consults: None  Significant Diagnostic Studies: labs:  CBC    Component Value Date/Time   WBC 9.2 10/20/2015 1115   RBC 4.28 10/20/2015 1115   RBC 3.60* 07/29/2015 1508   HGB 10.4* 10/20/2015 1115   HCT 35.8* 10/20/2015 1115   HCT 24.4* 07/29/2015 1508   PLT 399 10/20/2015 1115   MCV 83.6 10/20/2015 1115   MCH 24.3* 10/20/2015 1115   MCHC 29.1* 10/20/2015 1115   RDW 19.7* 10/20/2015 1115   LYMPHSABS 1.4 10/20/2015 1115   MONOABS 0.8 10/20/2015 1115   EOSABS 0.2 10/20/2015 1115   BASOSABS 0.0 10/20/2015 1115      Treatments: surgery: see above  Discharge Exam: Blood pressure 158/59, pulse 95, temperature 97.9 F (36.6 C), temperature source Oral, resp. rate 16, height 5' 6.5" (1.689 m), weight 255 lb (115.667 kg), last menstrual period 08/16/2015, SpO2 99 %. General appearance: alert and cooperative Resp: clear to auscultation bilaterally Cardio: regular rate and rhythm, S1, S2 normal, no murmur, click, rub or gallop GI: soft, non-tender; bowel sounds  normal; no masses,  no organomegaly Extremities: extremities normal, atraumatic, no cyanosis or edema Incision/Wound: clean and intact with staples  Disposition: 01-Home or Self Care  Discharge Instructions    (HEART FAILURE PATIENTS) Call MD:  Anytime you have any of the following symptoms: 1) 3 pound weight gain in 24 hours or 5 pounds in 1 week 2) shortness of breath, with or without a dry hacking cough 3) swelling in the hands, feet or stomach 4) if you have to sleep on extra pillows at night in order to breathe.    Complete by:  As directed      Call MD for:  difficulty breathing, headache or visual disturbances    Complete by:  As directed      Call MD for:  extreme fatigue    Complete by:  As directed      Call MD for:  hives    Complete by:  As directed      Call MD for:  persistant dizziness or light-headedness    Complete by:  As directed      Call MD for:  persistant nausea and vomiting    Complete by:  As directed      Call MD for:  redness, tenderness, or signs of infection (pain, swelling, redness, odor or green/yellow discharge around incision site)    Complete by:  As directed      Call MD for:  severe uncontrolled pain    Complete by:  As directed      Call MD for:  temperature >100.4  Complete by:  As directed      Diet - low sodium heart healthy    Complete by:  As directed      Diet general    Complete by:  As directed      Driving Restrictions    Complete by:  As directed   No driving for 7 days or until off narcotic pain medication     Increase activity slowly    Complete by:  As directed      Remove dressing in 24 hours    Complete by:  As directed      Sexual Activity Restrictions    Complete by:  As directed   No intercourse for 6 weeks            Medication List    TAKE these medications        acetaminophen 500 MG tablet  Commonly known as:  TYLENOL  Take 2 tablets (1,000 mg total) by mouth every 6 (six) hours.      aspirin-acetaminophen-caffeine 250-250-65 MG tablet  Commonly known as:  EXCEDRIN MIGRAINE  Take 1 tablet by mouth every 6 (six) hours as needed for headache.     carvedilol 3.125 MG tablet  Commonly known as:  COREG  Take 1.56 mg by mouth 2 (two) times daily with a meal.     docusate sodium 100 MG capsule  Commonly known as:  COLACE  Take 1 capsule (100 mg total) by mouth 2 (two) times daily.     docusate sodium 100 MG capsule  Commonly known as:  COLACE  Take 1 capsule (100 mg total) by mouth daily.     feeding supplement (ENSURE ENLIVE) Liqd  Take 237 mLs by mouth 2 (two) times daily between meals.     ferrous sulfate 325 (65 FE) MG tablet  Take 1 tablet (325 mg total) by mouth 2 (two) times daily with a meal.     hydrochlorothiazide 50 MG tablet  Commonly known as:  HYDRODIURIL  Take 25 mg by mouth daily.     ketorolac 10 MG tablet  Commonly known as:  TORADOL  Take 1 tablet (10 mg total) by mouth every 6 (six) hours as needed.     oxyCODONE 5 MG immediate release tablet  Commonly known as:  Oxy IR/ROXICODONE  Take 1 tablet (5 mg total) by mouth every 4 (four) hours as needed for severe pain or breakthrough pain.     polyethylene glycol packet  Commonly known as:  MIRALAX  Take 17 g by mouth daily.           Follow-up Information    Follow up In 4 days.   Why:  For suture removal      Follow up with CROSS, MELISSA DEAL, NP.   Specialty:  Gynecologic Oncology   Contact information:   [address] [place] [address] [phone]       Signed: Donaciano Eva 10/21/2015, 10:42 AM

## 2015-10-23 ENCOUNTER — Telehealth: Payer: Self-pay | Admitting: *Deleted

## 2015-10-23 NOTE — Telephone Encounter (Signed)
Left message on Patient's voicemail with  Appointment details . Pt is scheduled on  10/26/2015

## 2015-10-26 ENCOUNTER — Encounter: Payer: Self-pay | Admitting: Gynecologic Oncology

## 2015-10-26 ENCOUNTER — Ambulatory Visit: Payer: BLUE CROSS/BLUE SHIELD | Attending: Gynecologic Oncology | Admitting: Gynecologic Oncology

## 2015-10-26 VITALS — BP 152/88 | HR 99 | Temp 98.4°F | Resp 18 | Ht 66.5 in | Wt 249.4 lb

## 2015-10-26 DIAGNOSIS — R1900 Intra-abdominal and pelvic swelling, mass and lump, unspecified site: Secondary | ICD-10-CM | POA: Diagnosis not present

## 2015-10-26 NOTE — Patient Instructions (Signed)
Doing well post-operatively.  The steri strips on your incision will stay in place for around one week.  After that time, if they are still in place, you can remove them.  Please call for any questions or concerns.

## 2015-10-27 ENCOUNTER — Encounter: Payer: Self-pay | Admitting: Gynecologic Oncology

## 2015-10-31 NOTE — Progress Notes (Signed)
  This encounter was created in error - please disregard.  Patient checked into appt but did not need to be seen since she had not her colonoscopy prior to surgery.  Appt was rescheduled.

## 2015-10-31 NOTE — Progress Notes (Signed)
Follow Up Note: Gyn-Onc  [name] 45 y.o. female  CC:  Chief Complaint  Patient presents with  . Routine Post Op    follow up    HPI:  Renee Oconnor is a 45 year old woman initially seen in consultation at the request of Dr Wyline Copas for a large ovarian mass.  She was admitted on 07/28/15 for presumed colitis due to CT findings, fevers, and GI symptoms.  CT of the abdomen and pelvis on 07/28/15 showed : There is a large multi-septated cystic mass arising from the pelvis which measure 14.8 x 14.4 x 13.3 cm. It is difficult to determine exact origin of this tumor, but most likely is ovarian in etiology. There also appears to be inflammation involving a portion of the sigmoid colon that runs superior to this mass. Adjacent to this inflamed bowel loop, there is noted 3.1 x 2.3 cm low density which may represent adjacent abscess.  She initially reported symptoms prior to admission of 2 weeks of epigastric and mid abdominal pain, intermittent nausea and emesis and intermittent constipation.  She has a remote hx in 2006 of an ex lap, myomectomy and colonic resection with reanastamosis with Dr Raphael Gibney, which was reversed 6 months later.  She also underwent a colonoscopy pre-operatively.   On 10/17/15, she underwent an exploratory laparotomy, supracervical hysterectomy, bilateral salpingo-oophorectomy, lysis of adhesions, ileal resection with functional side to side anastamosis.  Final pathology revealed:  Diagnosis 1. Uterus, ovaries and fallopian tubes TUBO-OVARIAN ABSCESS AND SALPINGITIS ENDOMETRIUM: PROLIFERATIVE ENDOMETRIUM WITH SUBMUCOSAL LEIOMYOMA POLYP MYOMETRIUM: LEIOMYOMAS NEGATIVE FOR MALIGNANCY 2. Small intestine, resection, ileum SMALL BOWEL WITH SUBSEROSAL INFLAMMATION, EDEMA, HEMORRHAGIC CONGESTION AND ADHESION NEGATIVE FOR MALIGNANCY  Interval History:  She presents today with her husband for post-operative follow up.  She reports doing well at home.  Tolerating diet with no  nausea or emesis reported.  Bowels and bladder functioning without difficulty.  Minimal pain reported.  No issues or concerns voiced about her incision.  Denies vaginal bleeding or spotting.  Ambulating without difficulty.  Reporting mild LE edema.  No concerns voiced.  See below for more detailed review of systems.  Review of Systems: Constitutional: Feels well.  No fever, chills, early satiety, unintentional weight loss or gain.  Cardiovascular: No chest pain, shortness of breath, or edema.  Pulmonary: No cough or wheeze.  Gastrointestinal: No nausea, vomiting, or diarrhea. No bright red blood per rectum or change in bowel movement.  Genitourinary: No frequency, urgency, or dysuria. No vaginal bleeding or discharge.  Musculoskeletal: No myalgia or joint pain. Neurologic: No weakness, numbness, or change in gait.  Psychology: No depression, anxiety, or insomnia.  Current Meds:  Outpatient Encounter Prescriptions as of 10/26/2015  Medication Sig  . carvedilol (COREG) 3.125 MG tablet Take 1.56 mg by mouth 2 (two) times daily with a meal.   . docusate sodium (COLACE) 100 MG capsule Take 1 capsule (100 mg total) by mouth daily.  . feeding supplement, ENSURE ENLIVE, (ENSURE ENLIVE) LIQD Take 237 mLs by mouth 2 (two) times daily between meals.  . ferrous sulfate 325 (65 FE) MG tablet Take 1 tablet (325 mg total) by mouth 2 (two) times daily with a meal.  . hydrochlorothiazide (HYDRODIURIL) 50 MG tablet Take 25 mg by mouth daily.  Marland Kitchen ketorolac (TORADOL) 10 MG tablet Take 1 tablet (10 mg total) by mouth every 6 (six) hours as needed.  Marland Kitchen oxyCODONE (OXY IR/ROXICODONE) 5 MG immediate release tablet Take 1 tablet (5 mg total) by mouth  every 4 (four) hours as needed for severe pain or breakthrough pain.  . polyethylene glycol (MIRALAX) packet Take 17 g by mouth daily.  . [DISCONTINUED] docusate sodium (COLACE) 100 MG capsule Take 1 capsule (100 mg total) by mouth 2 (two) times daily.  Marland Kitchen acetaminophen  (TYLENOL) 500 MG tablet Take 2 tablets (1,000 mg total) by mouth every 6 (six) hours. (Patient not taking: Reported on 10/26/2015)  . aspirin-acetaminophen-caffeine (EXCEDRIN MIGRAINE) 250-250-65 MG tablet Take 1 tablet by mouth every 6 (six) hours as needed for headache. Reported on 10/26/2015   No facility-administered encounter medications on file as of 10/26/2015.    Allergy: No Known Allergies  Social Hx:   Social History   Social History  . Marital Status: Married    Spouse Name: N/A  . Number of Children: N/A  . Years of Education: N/A   Occupational History  . Not on file.   Social History Main Topics  . Smoking status: Never Smoker   . Smokeless tobacco: Never Used  . Alcohol Use: No  . Drug Use: No  . Sexual Activity: Not Currently   Other Topics Concern  . Not on file   Social History Narrative    Past Surgical Hx:  Past Surgical History  Procedure Laterality Date  . Ectopic pregnancy surgery    . Bowel resection  2006  . Laparotomy N/A 10/17/2015    Procedure: EXPLORATORY LAPAROTOMY ;  Surgeon: Everitt Amber, MD;  Location: [facility];  Service: Gynecology;  Laterality: N/A;  . Abdominal hysterectomy Left 10/17/2015    Procedure: SUPRACERVICAL  HYSTERECTOMY bILATERAL SALPINGO OOPHORECTOMY,SMALL BOWEL RESECTION, LYSIS OF ADHESIONS ;  Surgeon: Everitt Amber, MD;  Location: [facility];  Service: Gynecology;  Laterality: Left;    Past Medical Hx:  Past Medical History  Diagnosis Date  . Hypertension   . High cholesterol   . Eczema   . History of transfusion   . Borderline diabetes     DIET CONTROLLED  . Pelvic mass in female     Family Hx:  Family History  Problem Relation Age of Onset  . Family history unknown: Yes    Vitals:  Blood pressure 152/88, pulse 99, temperature 98.4 F (36.9 C), temperature source Oral, resp. rate 18, height 5' 6.5" (1.689 m), weight 249 lb 6.4 oz (113.127 kg), last menstrual period 08/12/2015, SpO2 100 %.  Physical Exam:  General:  Well developed, well nourished female in no acute distress. Alert and oriented x 3.  Cardiovascular: Regular rate and rhythm. S1 and S2 normal.  Lungs: Clear to auscultation bilaterally. No wheezes/crackles/rhonchi noted.  Skin: No rashes or lesions present. Back: No CVA tenderness.  Abdomen: Abdomen soft, non-tender and obese. Active bowel sounds in all quadrants. No evidence of a fluid wave or abdominal masses.  38 staples removed from the midline without difficulty.  No drainage or erythema noted.  1/2 inch steri strips applied.    Extremities: No bilateral cyanosis or clubbing. Mild bilateral pedal edema, non-pitting.   Assessment/Plan:  45 year old female s/p exploratory laparotomy, supracervical hysterectomy, bilateral salpingo-oophorectomy, lysis of adhesions, ileal resection with functional side to side anastamosis on 10/17/15.  She is doing well post-operatively.  Post-operative care reinforced including incisional care.  She is advised to follow up as scheduled on 11/06/15 or sooner if needed.  Reportable signs and symptoms reviewed.  She is advised to call for any questions or concerns.    Kateryn Marasigan DEAL, NP 10/31/2015, 3:28 PM

## 2015-11-06 ENCOUNTER — Encounter: Payer: Self-pay | Admitting: Gynecologic Oncology

## 2015-11-06 ENCOUNTER — Ambulatory Visit: Payer: Medicaid Other | Attending: Gynecologic Oncology | Admitting: Gynecologic Oncology

## 2015-11-06 VITALS — BP 163/94 | Ht 66.5 in | Wt 251.4 lb

## 2015-11-06 DIAGNOSIS — R7303 Prediabetes: Secondary | ICD-10-CM | POA: Insufficient documentation

## 2015-11-06 DIAGNOSIS — D250 Submucous leiomyoma of uterus: Secondary | ICD-10-CM | POA: Diagnosis not present

## 2015-11-06 DIAGNOSIS — D259 Leiomyoma of uterus, unspecified: Secondary | ICD-10-CM | POA: Diagnosis not present

## 2015-11-06 DIAGNOSIS — I10 Essential (primary) hypertension: Secondary | ICD-10-CM | POA: Diagnosis not present

## 2015-11-06 DIAGNOSIS — D487 Neoplasm of uncertain behavior of other specified sites: Secondary | ICD-10-CM

## 2015-11-06 DIAGNOSIS — N7093 Salpingitis and oophoritis, unspecified: Secondary | ICD-10-CM | POA: Diagnosis not present

## 2015-11-06 DIAGNOSIS — E7800 Pure hypercholesterolemia, unspecified: Secondary | ICD-10-CM | POA: Diagnosis not present

## 2015-11-06 DIAGNOSIS — L309 Dermatitis, unspecified: Secondary | ICD-10-CM | POA: Diagnosis not present

## 2015-11-06 DIAGNOSIS — R1013 Epigastric pain: Secondary | ICD-10-CM | POA: Diagnosis not present

## 2015-11-06 DIAGNOSIS — R1900 Intra-abdominal and pelvic swelling, mass and lump, unspecified site: Secondary | ICD-10-CM | POA: Insufficient documentation

## 2015-11-06 NOTE — Progress Notes (Signed)
Follow Up Note: Gyn-Onc  Renee Oconnor 45 y.o. female  CC:  Chief Complaint  Patient presents with  . pelvic mass    MD follow up    HPI:  Renee Oconnor is a 45 year old woman initially seen in consultation at the request of Dr Wyline Copas for a large ovarian mass.  She was admitted on 07/28/15 for presumed colitis due to CT findings, fevers, and GI symptoms.  CT of the abdomen and pelvis on 07/28/15 showed : There is a large multi-septated cystic mass arising from the pelvis which measure 14.8 x 14.4 x 13.3 cm. It is difficult to determine exact origin of this tumor, but most likely is ovarian in etiology. There also appears to be inflammation involving a portion of the sigmoid colon that runs superior to this mass. Adjacent to this inflamed bowel loop, there is noted 3.1 x 2.3 cm low density which may represent adjacent abscess.  She initially reported symptoms prior to admission of 2 weeks of epigastric and mid abdominal pain, intermittent nausea and emesis and intermittent constipation.  She has a remote hx in 2006 of an ex lap, myomectomy and colonic resection with reanastamosis with Dr Raphael Gibney, which was reversed 6 months later.  She also underwent a colonoscopy pre-operatively.   On 10/17/15, she underwent an exploratory laparotomy, supracervical hysterectomy, bilateral salpingo-oophorectomy, lysis of adhesions, ileal resection with functional side to side anastamosis.  Final pathology revealed:  Diagnosis 1. Uterus, ovaries and fallopian tubes TUBO-OVARIAN ABSCESS AND SALPINGITIS ENDOMETRIUM: PROLIFERATIVE ENDOMETRIUM WITH SUBMUCOSAL LEIOMYOMA POLYP MYOMETRIUM: LEIOMYOMAS NEGATIVE FOR MALIGNANCY 2. Small intestine, resection, ileum SMALL BOWEL WITH SUBSEROSAL INFLAMMATION, EDEMA, HEMORRHAGIC CONGESTION AND ADHESION NEGATIVE FOR MALIGNANCY  Intraoperative findings were significant for a large conglomeration of bilateral adnexal masses, fibroid uterus and dense adhesions to small  bowel.The bowel segment was resected and reanastamosed. A supracervical hysterectomy was performed due to the finding of a 1-2cm nodular mass extending from the posterior cervix to involve the anterior rectal wall. This would have necessitated a low anterior resection in order to resect en bloc and in this patient with a prior history of left sided colonic anastamosis, there is concern about duplicating anastamosis and adequacy of perfusion in the intervening colonic segment. Given that her pathology was benign it was felt best to not attempt resection of this asymptomatic lesion.  Interval History:  She presents today with her husband for post-operative follow up.  She reports doing well at home.  Tolerating diet with no nausea or emesis reported.  Bowels and bladder functioning without difficulty.  Minimal pain reported.  No issues or concerns voiced about her incision.  Denies vaginal bleeding or spotting.  Ambulating without difficulty.  Reporting mild LE edema.  No concerns voiced.  See below for more detailed review of systems.  Review of Systems: Constitutional: Feels well.  No fever, chills, early satiety, unintentional weight loss or gain.  Cardiovascular: No chest pain, shortness of breath, or edema.  Pulmonary: No cough or wheeze.  Gastrointestinal: No nausea, vomiting, or diarrhea. No bright red blood per rectum or change in bowel movement.  Genitourinary: No frequency, urgency, or dysuria. No vaginal bleeding or discharge.  Musculoskeletal: No myalgia or joint pain. Neurologic: No weakness, numbness, or change in gait.  Psychology: No depression, anxiety, or insomnia.  Current Meds:  Outpatient Encounter Prescriptions as of 11/06/2015  Medication Sig  . acetaminophen (TYLENOL) 500 MG tablet Take 2 tablets (1,000 mg total) by mouth every 6 (six) hours.  Marland Kitchen  carvedilol (COREG) 3.125 MG tablet Take 1.56 mg by mouth 2 (two) times daily with a meal.   . docusate sodium (COLACE) 100 MG capsule  Take 1 capsule (100 mg total) by mouth daily.  . feeding supplement, ENSURE ENLIVE, (ENSURE ENLIVE) LIQD Take 237 mLs by mouth 2 (two) times daily between meals.  . ferrous sulfate 325 (65 FE) MG tablet Take 1 tablet (325 mg total) by mouth 2 (two) times daily with a meal.  . hydrochlorothiazide (HYDRODIURIL) 50 MG tablet Take 25 mg by mouth daily.  Marland Kitchen oxyCODONE (OXY IR/ROXICODONE) 5 MG immediate release tablet Take 1 tablet (5 mg total) by mouth every 4 (four) hours as needed for severe pain or breakthrough pain.  . polyethylene glycol (MIRALAX) packet Take 17 g by mouth daily.  . [DISCONTINUED] ketorolac (TORADOL) 10 MG tablet Take 1 tablet (10 mg total) by mouth every 6 (six) hours as needed.  . [DISCONTINUED] aspirin-acetaminophen-caffeine (EXCEDRIN MIGRAINE) 250-250-65 MG tablet Take 1 tablet by mouth every 6 (six) hours as needed for headache. Reported on 11/06/2015   No facility-administered encounter medications on file as of 11/06/2015.    Allergy: No Known Allergies  Social Hx:   Social History   Social History  . Marital Status: Married    Spouse Name: N/A  . Number of Children: N/A  . Years of Education: N/A   Occupational History  . Not on file.   Social History Main Topics  . Smoking status: Never Smoker   . Smokeless tobacco: Never Used  . Alcohol Use: No  . Drug Use: No  . Sexual Activity: Not Currently   Other Topics Concern  . Not on file   Social History Narrative    Past Surgical Hx:  Past Surgical History  Procedure Laterality Date  . Ectopic pregnancy surgery    . Bowel resection  2006  . Laparotomy N/A 10/17/2015    Procedure: EXPLORATORY LAPAROTOMY ;  Surgeon: Everitt Amber, MD;  Location: [facility];  Service: Gynecology;  Laterality: N/A;  . Abdominal hysterectomy Left 10/17/2015    Procedure: SUPRACERVICAL  HYSTERECTOMY bILATERAL SALPINGO OOPHORECTOMY,SMALL BOWEL RESECTION, LYSIS OF ADHESIONS ;  Surgeon: Everitt Amber, MD;  Location: [facility];  Service:  Gynecology;  Laterality: Left;    Past Medical Hx:  Past Medical History  Diagnosis Date  . Hypertension   . High cholesterol   . Eczema   . History of transfusion   . Borderline diabetes     DIET CONTROLLED  . Pelvic mass in female     Family Hx:  Family History  Problem Relation Age of Onset  . Family history unknown: Yes    Vitals:  Blood pressure 163/94, height 5' 6.5" (1.689 m), weight 251 lb 6.4 oz (114.034 kg), last menstrual period 08/12/2015.  Physical Exam:  General: Well developed, well nourished female in no acute distress. Alert and oriented x 3.  Cardiovascular: Regular rate and rhythm. S1 and S2 normal.  Lungs: Clear to auscultation bilaterally. No wheezes/crackles/rhonchi noted.  Skin: No rashes or lesions present. Back: No CVA tenderness.  Abdomen: Abdomen soft, non-tender and obese. Active bowel sounds in all quadrants. No evidence of a fluid wave or abdominal masses.  Incision healing well with no signs of infection or hernia. Extremities: No bilateral cyanosis or clubbing. Mild bilateral pedal edema, non-pitting.   Assessment/Plan:  45 year old female s/p exploratory laparotomy, supracervical hysterectomy, bilateral salpingo-oophorectomy, lysis of adhesions, ileal resection with functional side to side anastamosis on 10/17/15 for  tubo-ovarian abscesses. She had an incidental finding of a rectovaginal septal 2cm nodule/mass extending from the posterior cervix. Her preoperative colonoscopy had been normal. She is doing well post-operatively.  Post-operative care reinforced including incisional care.  Posterior cervical/rectovaginal septal nodule: Given the benign findings on her pathology, and the asymptomatic nature of the rectovaginal septal nodule, I believe it is reasonable to expectantly monitor. I will talk to her GI, Dr Laurence Spates, about obtaining a transanal/transrectal [place] to measure and define the mass. We would then repeat imaging in 3-4 months to  monitor for interval growth. Resection would likely require rectal resection, which carries with it increased risk in this patient with a prior history of a colonic anastamosis.  Everitt Amber, MD  11/06/2015, 2:04 PM

## 2015-11-06 NOTE — Patient Instructions (Addendum)
We will contact you with the two [facility] appointment as well as the 4 month follow up with Dr Everitt Amber . Please call with any changes , questions or concerns [phone].  Thank you !

## 2015-11-07 ENCOUNTER — Telehealth: Payer: Self-pay

## 2015-11-07 NOTE — Telephone Encounter (Signed)
Orders received from [facility] ,APNP to contact Dr Laurence Spates from [facility]: [phone] , Fax: [phone] for an appointment about obtaining a transanal/transrectal [place] to measure and define the mass. We would then repeat imaging in 3-4 months to monitor for interval growth and follow up with Dr Everitt Amber per recommendations. Patient is aware of the referral for procedure.

## 2015-11-09 ENCOUNTER — Telehealth: Payer: Self-pay

## 2015-11-09 NOTE — Telephone Encounter (Signed)
Patient contacted and updated with appointment with [facility] has been scheduled per Dr Everitt Amber request for transanal/transrectal [place] to measure and define the mass. Consultation is with Dr Paulita Fujita on February 14 , 2017 at 2:30 , patient is to arrive at 2:15 with Insurance card , co-pay , photo ID and a list of current medications per receptionist Suanne Marker at [facility] . Attempted to reach the patient , no answer , left a detailed message with contact numbers provided for [facility]YN/[facility]C . Request was also left to call our office to confirm message was received with GI consultation date and time.

## 2015-11-13 ENCOUNTER — Telehealth: Payer: Self-pay

## 2015-11-13 NOTE — Telephone Encounter (Signed)
Follow up call placed to see the patient has been scheduled for her consultation with Dr Paulita Fujita at [facility] . Patient states she is scheduled this Wednesday , updated [employer] , APNP.

## 2015-12-07 ENCOUNTER — Telehealth: Payer: Self-pay

## 2015-12-07 NOTE — Telephone Encounter (Signed)
[facility] PH: [phone] contacted to see the patient completed her consultation with Dr Paulita Fujita for transanal / transrectal U/S per Dr Everitt Amber . Spoke with [employer] and the patient completed the consultation with Dr Paulita Fujita and the procedure is schedule for December 21, 2015 .

## 2015-12-08 ENCOUNTER — Other Ambulatory Visit: Payer: Self-pay | Admitting: Gastroenterology

## 2015-12-15 ENCOUNTER — Other Ambulatory Visit: Payer: Self-pay | Admitting: Gastroenterology

## 2015-12-21 ENCOUNTER — Ambulatory Visit (HOSPITAL_COMMUNITY)
Admission: RE | Admit: 2015-12-21 | Discharge: 2015-12-21 | Disposition: A | Discharge status: Home / Self Care | Payer: BLUE CROSS/BLUE SHIELD | Source: Ambulatory Visit | Attending: Gastroenterology | Admitting: Gastroenterology

## 2015-12-21 ENCOUNTER — Encounter (HOSPITAL_COMMUNITY): Payer: Self-pay | Admitting: *Deleted

## 2015-12-21 ENCOUNTER — Encounter (HOSPITAL_COMMUNITY)
Admission: RE | Disposition: A | Discharge status: Home / Self Care | Payer: Self-pay | Source: Ambulatory Visit | Attending: Gastroenterology

## 2015-12-21 DIAGNOSIS — K644 Residual hemorrhoidal skin tags: Secondary | ICD-10-CM | POA: Diagnosis present

## 2015-12-21 HISTORY — PX: EUS: SHX5427

## 2015-12-21 SURGERY — ULTRASOUND, LOWER GI TRACT, ENDOSCOPIC
Anesthesia: Moderate Sedation

## 2015-12-21 MED ORDER — FENTANYL CITRATE (PF) 100 MCG/2ML IJ SOLN
INTRAMUSCULAR | Status: DC | PRN
  Administered 2015-12-21 (×3): 25 ug via INTRAVENOUS

## 2015-12-21 MED ORDER — SODIUM CHLORIDE 0.9 % IV SOLN: INTRAVENOUS | Status: DC

## 2015-12-21 MED ORDER — SODIUM CHLORIDE 0.9 % IV SOLN
INTRAVENOUS | Status: DC
  Administered 2015-12-21: 500 mL via INTRAVENOUS

## 2015-12-21 MED ORDER — MIDAZOLAM HCL 5 MG/ML IJ SOLN
INTRAMUSCULAR | Status: AC
  Filled 2015-12-21: qty 2

## 2015-12-21 MED ORDER — FENTANYL CITRATE (PF) 100 MCG/2ML IJ SOLN
INTRAMUSCULAR | Status: AC
  Filled 2015-12-21: qty 2

## 2015-12-21 MED ORDER — MIDAZOLAM HCL 10 MG/2ML IJ SOLN
INTRAMUSCULAR | Status: DC | PRN
  Administered 2015-12-21: 1 mg via INTRAVENOUS
  Administered 2015-12-21: 2 mg via INTRAVENOUS
  Administered 2015-12-21: 1 mg via INTRAVENOUS
  Administered 2015-12-21: 2 mg via INTRAVENOUS

## 2015-12-21 NOTE — H&P (Signed)
Patient interval history reviewed.  Patient examined again.  There has been no change from documented H/P dated 11/14/15 (scanned into chart from our office) except as documented above.  Assessment:  1.  Rectovaginal nodule.  Negative colonoscopy.  Plan:  1.  Endorectal ultrasound for further evaluation. 2.  Risks (bleeding, infection, bowel perforation that could require surgery, sedation-related changes in cardiopulmonary systems), benefits (identification and possible treatment of source of symptoms, exclusion of certain causes of symptoms), and alternatives (watchful waiting, radiographic imaging studies, empiric medical treatment) of endorectal ultrasound were explained to patient/family in detail and patient wishes to proceed.

## 2015-12-21 NOTE — Op Note (Signed)
[facility] Patient Name: Renee Oconnor Procedure Date : 12/21/2015 MRN: [id number] Attending MD: Arta Silence , MD Date of Birth: 14-Feb-1971 CSN: [id number] Age: 45 Admit Type: Inpatient Procedure:                Lower EUS Indications:              Rectovaginal nodule seen on intraoperative                            evaluation Providers:                Arta Silence, MD, Cleda Daub, RN, Arieh Bogue Dalton, Technician Referring MD:             Everitt Amber, MD (GYN ONC) Medicines:                Fentanyl 75 micrograms IV, Midazolam 6 mg IV Complications:            No immediate complications. Estimated Blood Loss:     Estimated blood loss: none. Procedure:                Pre-Anesthesia Assessment:                           - Prior to the procedure, a History and Physical                            was performed, and patient medications and                            allergies were reviewed. The patient's tolerance of                            previous anesthesia was also reviewed. The risks                            and benefits of the procedure and the sedation                            options and risks were discussed with the patient.                            All questions were answered, and informed consent                            was obtained. Prior Anticoagulants: The patient has                            taken no previous anticoagulant or antiplatelet                            agents. ASA Grade Assessment: II - A patient with  mild systemic disease. After reviewing the risks                            and benefits, the patient was deemed in                            satisfactory condition to undergo the procedure.                           After obtaining informed consent, the endoscope was                            passed under direct vision. Throughout the   procedure, the patient's blood pressure, pulse, and                            oxygen saturations were monitored continuously. The                            [id number] ([id number]) scope was introduced through                            the anus and advanced to the the rectosigmoid                            junction for ultrasound. The lower EUS was                            accomplished without difficulty. The patient                            tolerated the procedure well. The quality of the                            bowel preparation was good. Scope In: Scope Out: Findings:      The perianal exam findings include non-thrombosed external hemorrhoids.      Endosonographic Finding: Normal-appearing vagina. :      The rectum was normal. Rectal wall was normal without intramural       abnormality. No obvious abnormality was seen between vagina and rectal       wall.      The perirectal space was normal. No obvious mass or perirectal       adenopathy was noted. Impression:               - Non-thrombosed external hemorrhoids found on                            perianal exam.                           - Endosonographic images of the rectum were                            unremarkable.                           -  Endosonographic images of the perirectal space                            were unremarkable.                           - No findings to correlate with intraoperative                            suspicion of 1.5 cm rectovaginal nodule were                            identified. Moderate Sedation:      Moderate (conscious) sedation was administered by the endoscopy nurse       and supervised by the endoscopist. The following parameters were       monitored: oxygen saturation, heart rate, blood pressure, and response       to care. Recommendation:           - The patient will be observed post-procedure,                            until all discharge criteria are met.                            - Discharge patient to home (via wheelchair).                           - Return to Dr. Denman George. Understanding that I did not                            see any rectovaginal nodule, which while reassuring                            doesn't exclude presence of abnormality, and if                            there remains high clinical concern for this                            intraoperative rectovaginal nodule, would consider                            transvaginal ultrasound +/- MRI Pelvis for further                            evaluation. There is no abnormality amenable to any                            further GI tract evaluation. Procedure Code(s):        --- Professional ---                           [phone], Sigmoidoscopy, flexible; with endoscopic  ultrasound examination Diagnosis Code(s):        --- Professional ---                           K64.4, Residual hemorrhoidal skin tags                           R93.3, Abnormal findings on diagnostic imaging of                            other parts of digestive tract CPT copyright 2016 [employer]. All rights reserved. The codes documented in this report are preliminary and upon coder review may  be revised to meet current compliance requirements. Arta Silence, MD Arta Silence, MD 12/21/2015 12:33:34 PM This report has been signed electronically. Number of Addenda: 0

## 2015-12-21 NOTE — Discharge Instructions (Signed)
Endorectal ultrasound ° °Post procedure instructions: ° °Read the instructions outlined below and refer to this sheet in the next few weeks. These discharge instructions provide you with general information on caring for yourself after you leave the hospital. Your doctor may also give you specific instructions. While your treatment has been planned according to the most current medical practices available, unavoidable complications occasionally occur. If you have any problems or questions after discharge, call Dr. Wreatha Sturgeon at [facility] ([phone]). ° °HOME CARE INSTRUCTIONS ° °ACTIVITY: °· You may resume your regular activity, but move at a slower pace for the next 24 hours.  °· Take frequent rest periods for the next 24 hours.  °· Walking will help get rid of the air and reduce the bloated feeling in your belly (abdomen).  °· No driving for 24 hours (because of the medicine (anesthesia) used during the test).  °· You may shower.  °· Do not sign any important legal documents or operate any machinery for 24 hours (because of the anesthesia used during the test).  °NUTRITION: °· Drink plenty of fluids.  °· You may resume your normal diet as instructed by your doctor.  °· Begin with a light meal and progress to your normal diet. Heavy or fried foods are harder to digest and may make you feel sick to your stomach (nauseated).  °· Avoid alcoholic beverages for 24 hours or as instructed.  °MEDICATIONS: °· You may resume your normal medications unless your doctor tells you otherwise.  °WHAT TO EXPECT TODAY: °· Some feelings of bloating in the abdomen.  °· Passage of more gas than usual.  °· Spotting of blood in your stool or on the toilet paper.  °IF YOU HAD POLYPS REMOVED DURING THE COLONOSCOPY: °· No aspirin products for 7 days or as instructed.  °· No alcohol for 7 days or as instructed.  °· Eat a soft diet for the next 24 hours.  ° °FINDING OUT THE RESULTS OF YOUR TEST ° °Not all test results are available  during your visit. If your test results are not back during the visit, make an appointment with your caregiver to find out the results. Do not assume everything is normal if you have not heard from your caregiver or the medical facility. It is important for you to follow up on all of your test results.  ° ° ° °SEEK IMMEDIATE MEDICAL CARE IF: ° °· You have more than a spotting of blood in your stool.  °· Your belly is swollen (abdominal distention).  °· You are nauseated or vomiting.  °· You have a fever.  °· You have abdominal pain or discomfort that is severe or gets worse throughout the day.  ° ° °Document Released: 04/30/2004 Document Revised: 05/29/2011 Document Reviewed: 04/28/2008 °[employer]® Patient Information ©2012 [employer]. ° °

## 2016-01-24 ENCOUNTER — Other Ambulatory Visit: Payer: Self-pay | Admitting: Gynecologic Oncology

## 2016-01-24 DIAGNOSIS — R1900 Intra-abdominal and pelvic swelling, mass and lump, unspecified site: Secondary | ICD-10-CM

## 2016-02-20 ENCOUNTER — Ambulatory Visit (HOSPITAL_COMMUNITY): Admission: RE | Admit: 2016-02-20 | Payer: BLUE CROSS/BLUE SHIELD | Source: Ambulatory Visit

## 2016-02-23 ENCOUNTER — Ambulatory Visit: Payer: BLUE CROSS/BLUE SHIELD | Admitting: Gynecologic Oncology

## 2019-06-21 ENCOUNTER — Emergency Department (HOSPITAL_COMMUNITY): Payer: BC Managed Care – PPO

## 2019-06-21 ENCOUNTER — Emergency Department (HOSPITAL_COMMUNITY)
Admission: EM | Admit: 2019-06-21 | Discharge: 2019-06-22 | Disposition: A | Discharge status: Home / Self Care | Payer: BC Managed Care – PPO | Attending: Emergency Medicine | Admitting: Emergency Medicine

## 2019-06-21 ENCOUNTER — Encounter (HOSPITAL_COMMUNITY): Payer: Self-pay

## 2019-06-21 ENCOUNTER — Other Ambulatory Visit: Payer: Self-pay

## 2019-06-21 DIAGNOSIS — Z79899 Other long term (current) drug therapy: Secondary | ICD-10-CM | POA: Diagnosis not present

## 2019-06-21 DIAGNOSIS — E119 Type 2 diabetes mellitus without complications: Secondary | ICD-10-CM | POA: Diagnosis not present

## 2019-06-21 DIAGNOSIS — I10 Essential (primary) hypertension: Secondary | ICD-10-CM | POA: Insufficient documentation

## 2019-06-21 DIAGNOSIS — R0789 Other chest pain: Secondary | ICD-10-CM | POA: Diagnosis present

## 2019-06-21 DIAGNOSIS — R0602 Shortness of breath: Secondary | ICD-10-CM | POA: Diagnosis not present

## 2019-06-21 DIAGNOSIS — Z20828 Contact with and (suspected) exposure to other viral communicable diseases: Secondary | ICD-10-CM | POA: Diagnosis not present

## 2019-06-21 DIAGNOSIS — R918 Other nonspecific abnormal finding of lung field: Secondary | ICD-10-CM

## 2019-06-21 DIAGNOSIS — Z7984 Long term (current) use of oral hypoglycemic drugs: Secondary | ICD-10-CM | POA: Insufficient documentation

## 2019-06-21 DIAGNOSIS — R0609 Other forms of dyspnea: Secondary | ICD-10-CM

## 2019-06-21 LAB — CBC
HCT: 37.2 % (ref 36.0–46.0)
Hemoglobin: 11.5 g/dL — ABNORMAL LOW (ref 12.0–15.0)
MCH: 25.8 pg — ABNORMAL LOW (ref 26.0–34.0)
MCHC: 30.9 g/dL (ref 30.0–36.0)
MCV: 83.6 fL (ref 80.0–100.0)
Platelets: 250 10*3/uL (ref 150–400)
RBC: 4.45 MIL/uL (ref 3.87–5.11)
RDW: 16.2 % — ABNORMAL HIGH (ref 11.5–15.5)
WBC: 7.7 10*3/uL (ref 4.0–10.5)
nRBC: 0 % (ref 0.0–0.2)

## 2019-06-21 LAB — TROPONIN I (HIGH SENSITIVITY)
Troponin I (High Sensitivity): 5 ng/L (ref <18)
Troponin I (High Sensitivity): 5 ng/L (ref <18)

## 2019-06-21 LAB — BASIC METABOLIC PANEL
Anion gap: 9 (ref 5–15)
BUN: 11 mg/dL (ref 6–20)
CO2: 25 mmol/L (ref 22–32)
Calcium: 9.7 mg/dL (ref 8.9–10.3)
Chloride: 107 mmol/L (ref 98–111)
Creatinine, Ser: 0.79 mg/dL (ref 0.44–1.00)
GFR calc Af Amer: >60 mL/min (ref >60)
GFR calc non Af Amer: >60 mL/min (ref >60)
Glucose, Bld: 86 mg/dL (ref 70–99)
Potassium: 3.8 mmol/L (ref 3.5–5.1)
Sodium: 141 mmol/L (ref 135–145)

## 2019-06-21 LAB — BRAIN NATRIURETIC PEPTIDE: B Natriuretic Peptide: 90.4 pg/mL (ref 0.0–100.0)

## 2019-06-21 MED ORDER — SODIUM CHLORIDE 0.9% FLUSH
3.0000 mL | Freq: Once | INTRAVENOUS | Status: AC
  Administered 2019-06-21: 3 mL via INTRAVENOUS

## 2019-06-21 MED ORDER — HYDRALAZINE HCL 20 MG/ML IJ SOLN
5.0000 mg | Freq: Once | INTRAMUSCULAR | Status: AC
  Administered 2019-06-21: 5 mg via INTRAVENOUS
  Filled 2019-06-21: qty 1

## 2019-06-21 NOTE — ED Triage Notes (Signed)
Pt states she started getting short of breath with exertion on Saturday. Pt went to PCP today for shortness of breath and chest pain. Pt states she was told to come here. Pt denies any chest pain and shortness of breath at this time.

## 2019-06-21 NOTE — ED Provider Notes (Signed)
[facility] EMERGENCY DEPARTMENT Provider Note   CSN: [id number] Arrival date & time: 06/21/19  1519     History   Chief Complaint Chief Complaint  Patient presents with   Chest Pain   Shortness of Breath    HPI Renee Oconnor is a 48 y.o. female with history of obesity, HTN, HLD, diabetes on oral agents presents to the ER for evaluation of central chest discomfort described as "mild tightness" that began Saturday morning.  She woke up feeling fine and when she started moving around she noticed this.  It is associated with breathlessness, feeling winded.  Symptoms happen with exertion.  States short distances and minimal exertion are fine but if she walks farther and for longer she gets chest discomfort and breathlessness.  For example, she walked from the waiting room to the ER room and she had to slow down because she was short of breath and having chest discomfort.  Yesterday she went to see cars and she had to walk slower.  She has mild chest discomfort if she takes deep breaths but otherwise is asymptomatic at rest.  After resting the symptoms go away in 2 to 3 minutes.  She takes 1 blood pressure medicine twice daily but does not know the name of it.  Admits that last Thursday she ran out of this.  She went to her PCP earlier today who told her to come to the ER due to her chest discomfort and blood pressure.  Does not check her blood pressure at home but last PCP appointment 6 months ago her BP was in the 150s/70s.  Through the weekend she got over-the-counter diuretic Durex to control her BP until she could refill her blood pressure medicines. She denies any recent extremity swelling states 2 months ago she missed her blood pressure medicine 1 day and woke up with her right foot swollen.  She got home and took her blood pressure medicine this went away.    No personal or family history of heart disease.  No associated lightheadedness, palpitations, nausea, vomiting,  diaphoresis, arm/jaw or neck discomfort, orthopnea, PND, cough.  No fever.  No history of DVT/PE.  No recent surgery, prolonged immobilization, calf pain, hemoptysis, hormone therapy.     HPI  Past Medical History:  Diagnosis Date   Borderline diabetes    DIET CONTROLLED   Eczema    High cholesterol    History of transfusion    Hypertension    Pelvic mass in female     Patient Active Problem List   Diagnosis Date Noted   Pelvic mass in female 10/17/2015   Acute blood loss anemia 07/28/2015   Ovarian mass 07/28/2015   Sepsis ([place]) 07/28/2015   Acute abdominal pain with nauase and non bloody vomiting  07/28/2015   Thrombocytosis ([place]) 07/28/2015   Absolute anemia     Past Surgical History:  Procedure Laterality Date   ABDOMINAL HYSTERECTOMY Left 10/17/2015   Procedure: SUPRACERVICAL  HYSTERECTOMY bILATERAL SALPINGO OOPHORECTOMY,SMALL BOWEL RESECTION, LYSIS OF ADHESIONS ;  Surgeon: Everitt Amber, MD;  Location: [facility];  Service: Gynecology;  Laterality: Left;   BOWEL RESECTION  2006   ECTOPIC PREGNANCY SURGERY     EUS N/A 12/21/2015   Procedure: LOWER ENDOSCOPIC ULTRASOUND (EUS);  Surgeon: Arta Silence, MD;  Location: [facility] ENDOSCOPY;  Service: Endoscopy;  Laterality: N/A;   LAPAROTOMY N/A 10/17/2015   Procedure: EXPLORATORY LAPAROTOMY ;  Surgeon: Everitt Amber, MD;  Location: [facility];  Service: Gynecology;  Laterality: N/A;     OB History   No obstetric history on file.      Home Medications    Prior to Admission medications   Medication Sig Start Date End Date Taking? Authorizing Provider  lisinopril-hydrochlorothiazide (ZESTORETIC) 10-12.5 MG tablet Take 1 tablet by mouth 2 (two) times daily. 03/03/19  Yes [provider]  acetaminophen (TYLENOL) 500 MG tablet Take 2 tablets (1,000 mg total) by mouth every 6 (six) hours. Patient not taking: Reported on 06/21/2019 10/21/15   Everitt Amber, MD  docusate sodium (COLACE) 100 MG capsule Take 1 capsule (100 mg  total) by mouth daily. Patient not taking: Reported on 06/21/2019 10/21/15   Everitt Amber, MD  doxycycline (VIBRAMYCIN) 100 MG capsule Take 1 capsule (100 mg total) by mouth 2 (two) times daily. 06/22/19   Kinnie Feil, PA-C  feeding supplement, ENSURE ENLIVE, (ENSURE ENLIVE) LIQD Take 237 mLs by mouth 2 (two) times daily between meals. Patient not taking: Reported on 06/21/2019 10/21/15   Everitt Amber, MD  ferrous sulfate 325 (65 FE) MG tablet Take 1 tablet (325 mg total) by mouth 2 (two) times daily with a meal. Patient not taking: Reported on 06/21/2019 08/02/15   Oswald Hillock, MD  oxyCODONE (OXY IR/ROXICODONE) 5 MG immediate release tablet Take 1 tablet (5 mg total) by mouth every 4 (four) hours as needed for severe pain or breakthrough pain. Patient not taking: Reported on 06/21/2019 10/21/15   Everitt Amber, MD  polyethylene glycol [facility]) packet Take 17 g by mouth daily. Patient not taking: Reported on 06/21/2019 08/01/15   Donne Hazel, MD  amLODipine-valsartan (EXFORGE) 10-160 MG per tablet Take 1 tablet by mouth daily.  07/20/15  [provider]    Family History Family History  Family history unknown: Yes    Social History Social History   Tobacco Use   Smoking status: Never Smoker   Smokeless tobacco: Never Used  Substance Use Topics   Alcohol use: No   Drug use: No     Allergies   Patient has no known allergies.   Review of Systems Review of Systems  Respiratory: Positive for chest tightness and shortness of breath.   Cardiovascular: Positive for chest pain.  All other systems reviewed and are negative.    Physical Exam Updated Vital Signs BP (!) 181/100    Pulse 87    Temp 98.7 F (37.1 C) (Oral)    Resp 19    SpO2 97%   Physical Exam Constitutional:      Appearance: She is well-developed.     Comments: NAD. Non toxic.   HENT:     Head: Normocephalic and atraumatic.     Nose: Nose normal.  Eyes:     General: Lids are normal.      Conjunctiva/sclera: Conjunctivae normal.  Neck:     Musculoskeletal: Normal range of motion.     Trachea: Trachea normal.     Comments: Trachea midline.  Cardiovascular:     Rate and Rhythm: Normal rate and regular rhythm.     Pulses:          Radial pulses are 1+ on the right side and 1+ on the left side.       Dorsalis pedis pulses are 1+ on the right side and 1+ on the left side.     Heart sounds: Normal heart sounds, S1 normal and S2 normal.     Comments: No LE edema or calf tenderness.  Pulmonary:  Effort: Pulmonary effort is normal.     Breath sounds: Normal breath sounds.  Abdominal:     General: Bowel sounds are normal.     Palpations: Abdomen is soft.     Tenderness: There is no abdominal tenderness.     Comments: No epigastric tenderness. No distention.   Skin:    General: Skin is warm and dry.     Capillary Refill: Capillary refill takes less than 2 seconds.     Comments: No rash to chest wall  Neurological:     Mental Status: She is alert.     GCS: GCS eye subscore is 4. GCS verbal subscore is 5. GCS motor subscore is 6.     Comments: Sensation and strength is intact in upper and lower extremities  Psychiatric:        Speech: Speech normal.        Behavior: Behavior normal.        Thought Content: Thought content normal.      ED Treatments / Results  Labs (all labs ordered are listed, but only abnormal results are displayed) Labs Reviewed  CBC - Abnormal; Notable for the following components:      Result Value   Hemoglobin 11.5 (*)    MCH 25.8 (*)    RDW 16.2 (*)    All other components within normal limits  D-DIMER, QUANTITATIVE (NOT AT [facility]) - Abnormal; Notable for the following components:   D-Dimer, Quant 0.64 (*)    All other components within normal limits  SARS CORONAVIRUS 2 (HOSPITAL ORDER, [facility])  BASIC METABOLIC PANEL  BRAIN NATRIURETIC PEPTIDE  TROPONIN I (HIGH SENSITIVITY)  TROPONIN I (HIGH SENSITIVITY)     EKG EKG Interpretation  Date/Time:  Monday June 21 2019 15:27:02 EDT Ventricular Rate:  112 PR Interval:  174 QRS Duration: 82 QT Interval:  346 QTC Calculation: 472 R Axis:   68 Text Interpretation:  Sinus tachycardia Cannot rule out Anterior infarct , age undetermined Abnormal ECG No significant change since last tracing Confirmed by Pryor Curia [phone]) on 06/22/2019 1:33:42 AM   Radiology Dg Chest 2 View  Result Date: 06/21/2019 CLINICAL DATA:  Chest pain. Shortness of breath with exertion. EXAM: CHEST - 2 VIEW COMPARISON:  08/01/2015 FINDINGS: Chronic cardiomegaly. Slight pulmonary vascular congestion which is new since the prior study. No discrete infiltrates or effusions. No significant bone abnormality. IMPRESSION: New slight pulmonary vascular congestion. Chronic cardiomegaly. No discrete infiltrates. No pleural effusions or pneumothorax. Electronically Signed   By: Lorriane Shire M.D.   On: 06/21/2019 16:38   Ct Angio Chest Pe W And/or Wo Contrast  Result Date: 06/22/2019 CLINICAL DATA:  48 year old female with shortness of breath and elevated D-dimer. Concern for pulmonary embolism. EXAM: CT ANGIOGRAPHY CHEST WITH CONTRAST TECHNIQUE: Multidetector CT imaging of the chest was performed using the standard protocol during bolus administration of intravenous contrast. Multiplanar CT image reconstructions and MIPs were obtained to evaluate the vascular anatomy. CONTRAST:  65mL OMNIPAQUE IOHEXOL 350 MG/ML SOLN COMPARISON:  Chest radiograph dated 06/21/2019 FINDINGS: Cardiovascular: Borderline cardiomegaly. No pericardial effusion. The thoracic aorta is unremarkable. Evaluation of the pulmonary arteries is limited due to suboptimal opacification and timing of the contrast. No definite large or central pulmonary artery embolus identified. Mediastinum/Nodes: There is no hilar or mediastinal adenopathy. The esophagus is grossly unremarkable. No mediastinal fluid collection.  Lungs/Pleura: There is a 12 mm nodule in the right middle lobe (series 6 image 76). Areas of subpleural hazy  density primarily involving the medial aspect the right lower lobe as well as in the lingula noted which may represent developing infiltrate or atypical infection. Clinical correlation is recommended. There is no pleural effusion or pneumothorax. The central airways are patent. Upper Abdomen: Faint splenic hypodense lesions are not characterized. In defined low attenuating area in the upper pole of the right kidney is not well visualized or characterized. Musculoskeletal: Degenerative changes of the spine. No acute osseous pathology. Review of the MIP images confirms the above findings. IMPRESSION: 1. No CT evidence of central pulmonary artery embolus. 2. A 12 mm right middle lobe nodule. Follow-up with CT in 3 months recommended. 3. Areas of subpleural hazy density primarily involving the right lower lobe as well as in the lingula may represent developing infiltrate or atypical infection. Clinical correlation is recommended. Aortic Atherosclerosis (ICD10-I70.0). Electronically Signed   By: Anner Crete M.D.   On: 06/22/2019 02:33    Procedures Procedures (including critical care time)  Medications Ordered in ED Medications  sodium chloride flush (NS) 0.9 % injection 3 mL (3 mLs Intravenous Given 06/21/19 2347)  hydrALAZINE (APRESOLINE) injection 5 mg (5 mg Intravenous Given 06/21/19 2358)  iohexol (OMNIPAQUE) 350 MG/ML injection 80 mL (80 mLs Intravenous Contrast Given 06/22/19 0149)     Initial Impression / Assessment and Plan / ED Course  I have reviewed the triage vital signs and the nursing notes.  Pertinent labs & imaging results that were available during my care of the patient were reviewed by me and considered in my medical decision making (see chart for details).  Clinical Course as of Jun 21 699  Mon Jun 21, 2019  2225 New slight pulmonary vascular congestion. Chronic  cardiomegaly. No discrete infiltrates. No pleural effusions or pneumothorax.  DG Chest 2 View [CG]  2225 Pulse Rate(!): 108 [CG]  2226 BP(!): 233/128 [CG]  2347 IMPRESSION: New slight pulmonary vascular congestion. Chronic cardiomegaly. No discrete infiltrates. No pleural effusions or pneumothorax.  DG Chest 2 View [CG]  2347 B Natriuretic Peptide: 90.4 [CG]  2347 Troponin I (High Sensitivity): 5 [CG]  Tue Jun 22, 2019  0134 Sinus tachycardia   ED EKG [CG]  0243 IMPRESSION: 1. No CT evidence of central pulmonary artery embolus. 2. A 12 mm right middle lobe nodule. Follow-up with CT in 3 months recommended. 3. Areas of subpleural hazy density primarily involving the right lower lobe as well as in the lingula may represent developing infiltrate or atypical infection. Clinical correlation is recommended.   CT Angio Chest PE W and/or Wo Contrast [CG]  0419 SARS Coronavirus 2: NEGATIVE [CG]    Clinical Course User Index [CG] Kinnie Feil, PA-C   48 year old is here with new onset exertional shortness of breath and chest tightness.  No infectious symptoms.  No signs of hypervolemia on exam.  She has no risk factors for PE or DVT.  She arrives tachycardic 108 and very hypertensive 233/128 in setting of recent medical noncompliance.  I am concerned about ACS, new onset heart failure, hypertensive emergency and PE.  Doubt COVID-19 and pneumonia given lack of infectious symptoms.  She has no tearing back pain, abdominal pain, neuro pulse deficits distally and dissection is less likely.  She has no underlying lung disease such as asthma, COPD.  ER work-up initiated in triage, reviewed by me.  Chest x-ray shows new mild pulmonary vascular congestion and chronic cardiomegaly.  BNP was added but this was 90.4.  Clinically she does not look hypervolemic.  No known history of heart failure.  Troponin x2 unremarkable.  EKG with out signs of ischemia shows sinus tachycardia.  D-dimer was elevated so  CTA was obtained that showed no pulmonary embolism but showed densities to the right and left lower lobes concerning for infiltrate or atypical infection.  Patient again denied infectious symptoms including fever, cough, exposure to COVID-19.  Patient was given hydralazine with improvement in her blood pressure.  She ambulated twice in the ER without hypoxemia although she did appear winded clinically.  Has been intermittently tachypneic.  Her heart score is 4 giving her elevated BMI, cardiac risk factors including hypertension, hyperlipidemia, diabetes.  Given her exertional symptoms I am concerned about unstable angina however her cardiac work-up today is reassuring.  She has never had cardiac work-up however.  Discussed with patient abnormal CT.  She could have subclinical pneumonia without any symptoms right now.  Given her heart score, exertional symptoms and abnormal CT I recommended admission for community-acquired pneumonia and unstable angina/cardiac work-up.  Patient states she has PCP appointment at 8 AM today and would prefer to be discharged.  I discussed importance of close follow-up.  She will benefit from cardiac work-up.  Strict return precautions given.  Patient is comfortable with this.  Discussed with EDP.  Final Clinical Impressions(s) / ED Diagnoses   Final diagnoses:  Exertional dyspnea  Abnormal CT scan of lung    ED Discharge Orders         Ordered    doxycycline (VIBRAMYCIN) 100 MG capsule  2 times daily     06/22/19 0538           Kinnie Feil, PA-C 06/22/19 0700    Fatima Blank, MD 06/23/19 [phone]

## 2019-06-22 ENCOUNTER — Emergency Department (HOSPITAL_COMMUNITY): Payer: BC Managed Care – PPO

## 2019-06-22 ENCOUNTER — Encounter (HOSPITAL_COMMUNITY): Payer: Self-pay | Admitting: Radiology

## 2019-06-22 LAB — SARS CORONAVIRUS 2 BY RT PCR (HOSPITAL ORDER, PERFORMED IN ~~LOC~~ HOSPITAL LAB): SARS Coronavirus 2: NEGATIVE

## 2019-06-22 LAB — D-DIMER, QUANTITATIVE (NOT AT ARMC): D-Dimer, Quant: 0.64 ug/mL-FEU — ABNORMAL HIGH (ref 0.00–0.50)

## 2019-06-22 MED ORDER — IOHEXOL 350 MG/ML SOLN
80.0000 mL | Freq: Once | INTRAVENOUS | Status: AC | PRN
  Administered 2019-06-22: 02:00:00 80 mL via INTRAVENOUS

## 2019-06-22 MED ORDER — DOXYCYCLINE HYCLATE 100 MG PO CAPS: 100.0000 mg | ORAL_CAPSULE | Freq: Two times a day (BID) | ORAL | 0 refills | Status: DC

## 2019-06-22 NOTE — ED Notes (Addendum)
Pt was ambulated throughout the unit of the ED for about 3-4 min. Pt maintained a good O2 sat of 95-96 throughout the walk. When returned to rm, pt was not as SOB when returning to rm and had also been in conversation throughout the walk. Pt tolerated walk very well.

## 2019-06-22 NOTE — ED Notes (Addendum)
Pt was ambulated around the ED unit for 3-4 min. Pt maintained O2 sat. of 95-100 but was very SOB upon returning to room. Pt's speech was very diminished because of SOB

## 2019-06-22 NOTE — ED Notes (Signed)
Patient transported to CT 

## 2019-06-22 NOTE — Discharge Instructions (Signed)
You were seen in the ER for shortness of breath and chest discomfort with activity.  Work-up today was essentially reassuring but CT scan of your chest showed opacities in your right and left lower lobes.  You have no signs or symptoms of pneumonia but we will treat you for this with antibiotic.  We discussed concern regarding your chest discomfort and shortness of breath with exertion and recommended admission for observation and cardiac work-up but you opted to be discharged.  Go to your primary care doctor appointment tomorrow and discuss your symptoms and ER visit today.  You should get referral to see a cardiologist.  Check your oxygen levels at home with a pulse oximeter if able.  Return to the ER immediately if there is fever greater than 100, worsening chest pain or shortness of breath, oxygen levels less than 92 to 94% when you are walking.

## 2019-07-05 ENCOUNTER — Other Ambulatory Visit: Payer: Self-pay | Admitting: Family Medicine

## 2019-07-06 ENCOUNTER — Other Ambulatory Visit: Payer: Self-pay

## 2019-07-06 ENCOUNTER — Ambulatory Visit
Admission: EM | Admit: 2019-07-06 | Discharge: 2019-07-06 | Disposition: A | Discharge status: Home / Self Care | Payer: BC Managed Care – PPO

## 2019-07-06 DIAGNOSIS — M25561 Pain in right knee: Secondary | ICD-10-CM

## 2019-07-06 DIAGNOSIS — I10 Essential (primary) hypertension: Secondary | ICD-10-CM | POA: Diagnosis not present

## 2019-07-06 HISTORY — DX: Type 2 diabetes mellitus without complications: E11.9

## 2019-07-06 NOTE — ED Provider Notes (Signed)
[facility]    CSN: [id number] Arrival date & time: 07/06/19  1442      History   Chief Complaint Chief Complaint  Patient presents with  . Knee Pain    HPI Renee Oconnor is a 48 y.o. female with history of diabetes, hypertension, morbid obesity presenting for right knee pain since yesterday.  Patient works at [employer], has been working a lot more hours lately, dizzy on her feet, "walking all over the place".  Denies history of arthritis, trauma to the area.  Patient states pain is worse the more she puts weight on it.  Denies obvious deformity from baseline.  Is not taking anything for pain.   Past Medical History:  Diagnosis Date  . Borderline diabetes    DIET CONTROLLED  . Diabetes mellitus without complication ([place])   . Eczema   . High cholesterol   . History of transfusion   . Hypertension   . Pelvic mass in female     Patient Active Problem List   Diagnosis Date Noted  . Pelvic mass in female 10/17/2015  . Acute blood loss anemia 07/28/2015  . Ovarian mass 07/28/2015  . Sepsis ([place]) 07/28/2015  . Acute abdominal pain with nauase and non bloody vomiting  07/28/2015  . Thrombocytosis ([place]) 07/28/2015  . Absolute anemia     Past Surgical History:  Procedure Laterality Date  . ABDOMINAL HYSTERECTOMY Left 10/17/2015   Procedure: SUPRACERVICAL  HYSTERECTOMY bILATERAL SALPINGO OOPHORECTOMY,SMALL BOWEL RESECTION, LYSIS OF ADHESIONS ;  Surgeon: Everitt Amber, MD;  Location: [facility];  Service: Gynecology;  Laterality: Left;  . BOWEL RESECTION  2006  . ECTOPIC PREGNANCY SURGERY    . EUS N/A 12/21/2015   Procedure: LOWER ENDOSCOPIC ULTRASOUND (EUS);  Surgeon: Arta Silence, MD;  Location[facility] END[facility];  Service: Endoscopy;  Laterality: N/A;  . LAPAROTOMY N/A 10/17/2015   Procedure: EXPLORATORY LAPAROTOMY ;  Surgeon: Everitt Amber, MD;  Location: [facility];  Service: Gynecology;  Laterality: N/A;    OB History   No obstetric history on file.      Home  Medications    Prior to Admission medications   Medication Sig Start Date End Date Taking? Authorizing Provider  metFORMIN (GLUCOPHAGE) 1000 MG tablet Take 1,000 mg by mouth 2 (two) times daily with a meal.   Yes [provider]  feeding supplement, ENSURE ENLIVE, (ENSURE ENLIVE) LIQD Take 237 mLs by mouth 2 (two) times daily between meals. Patient not taking: Reported on 06/21/2019 10/21/15   Everitt Amber, MD  ferrous sulfate 325 (65 FE) MG tablet Take 1 tablet (325 mg total) by mouth 2 (two) times daily with a meal. Patient not taking: Reported on 06/21/2019 08/02/15   Oswald Hillock, MD  lisinopril-hydrochlorothiazide (ZESTORETIC) 10-12.5 MG tablet Take 1 tablet by mouth 2 (two) times daily. 03/03/19   [provider]  polyethylene glycol (MIRALAX) packet Take 17 g by mouth daily. Patient not taking: Reported on 06/21/2019 08/01/15   Donne Hazel, MD  amLODipine-valsartan (EXFORGE) 10-160 MG per tablet Take 1 tablet by mouth daily.  07/20/15  [provider]    Family History Family History  Family history unknown: Yes    Social History Social History   Tobacco Use  . Smoking status: Never Smoker  . Smokeless tobacco: Never Used  Substance Use Topics  . Alcohol use: No  . Drug use: No     Allergies   Patient has no known allergies.   Review of Systems Review  of Systems  Constitutional: Negative for fatigue and fever.  Respiratory: Negative for cough and shortness of breath.   Cardiovascular: Negative for chest pain and palpitations.  Musculoskeletal:       Positive for right knee pain  Neurological: Negative for weakness and numbness.     Physical Exam Triage Vital Signs ED Triage Vitals  Enc Vitals Group     BP 07/06/19 1450 (!) 166/88     Pulse Rate 07/06/19 1450 100     Resp 07/06/19 1450 16     Temp 07/06/19 1450 98.7 F (37.1 C)     Temp Source 07/06/19 1450 Oral     SpO2 07/06/19 1450 97 %     Weight --      Height --      Head  Circumference --      Peak Flow --      Pain Score 07/06/19 1501 0     Pain Loc --      Pain Edu? --      Excl. in [place]? --    No data found.  Updated Vital Signs BP (!) 166/88 (BP Location: Left Arm) Comment: Pt took HTN meds today  Pulse 100   Temp 98.7 F (37.1 C) (Oral)   Resp 16   SpO2 97%   Visual Acuity Right Eye Distance:   Left Eye Distance:   Bilateral Distance:    Right Eye Near:   Left Eye Near:    Bilateral Near:     Physical Exam Constitutional:      General: She is not in acute distress. HENT:     Head: Normocephalic and atraumatic.  Eyes:     General: No scleral icterus.    Pupils: Pupils are equal, round, and reactive to light.  Cardiovascular:     Rate and Rhythm: Normal rate.  Pulmonary:     Effort: Pulmonary effort is normal.  Musculoskeletal: Normal range of motion.        General: No swelling or deformity.     Right lower leg: No edema.     Left lower leg: No edema.     Comments: No patellar tenderness, calf tenderness.  Negative Homans sign.  Mild crepitus of knee joint.  Active ROM intact.  Strength 5/5 bilaterally symmetric.  Gait mildly antalgic, favoring right.  Skin:    Coloration: Skin is not jaundiced or pale.  Neurological:     Mental Status: She is alert and oriented to person, place, and time.      UC Treatments / Results  Labs (all labs ordered are listed, but only abnormal results are displayed) Labs Reviewed - No data to display  EKG   Radiology No results found.  Procedures Procedures (including critical care time)  Medications Ordered in UC Medications - No data to display  Initial Impression / Assessment and Plan / UC Course  I have reviewed the triage vital signs and the nursing notes.  Pertinent labs & imaging results that were available during my care of the patient were reviewed by me and considered in my medical decision making (see chart for details).     1.  Acute pain of right knee Likely second  to overuse.  No injury or patellar tenderness: Radiography deferred.  Provided Ace wrap in office which patient tolerated well.  Will treat conservatively as listed below.  Return precautions discussed, patient verbalized understanding and is agreeable to plan. Final Clinical Impressions(s) / UC Diagnoses   Final diagnoses:  Acute pain of right knee     Discharge Instructions     May ice, rest, elevate the area(s) of pain.   May use OTC Tylenol, ibuprofen as needed for pain. Return if you develop worsening pain, chest pain, difficulty breathing.    ED Prescriptions    None     PDMP not reviewed this encounter.   Hall[place], [place], [place] 07/06/19 1959

## 2019-07-06 NOTE — ED Triage Notes (Signed)
Pt c/o rt knee pain since yesterday, denies injury.

## 2019-07-06 NOTE — Discharge Instructions (Signed)
May ice, rest, elevate the area(s) of pain.   May use OTC Tylenol, ibuprofen as needed for pain. Return if you develop worsening pain, chest pain, difficulty breathing.

## 2019-07-09 ENCOUNTER — Other Ambulatory Visit: Payer: Self-pay | Admitting: Family Medicine

## 2019-07-09 DIAGNOSIS — R911 Solitary pulmonary nodule: Secondary | ICD-10-CM

## 2019-08-12 ENCOUNTER — Ambulatory Visit (INDEPENDENT_AMBULATORY_CARE_PROVIDER_SITE_OTHER): Payer: BC Managed Care – PPO | Admitting: Cardiology

## 2019-08-12 ENCOUNTER — Encounter: Payer: Self-pay | Admitting: Cardiology

## 2019-08-12 ENCOUNTER — Other Ambulatory Visit: Payer: Self-pay

## 2019-08-12 VITALS — BP 184/105 | HR 99 | Ht 67.0 in | Wt 315.0 lb

## 2019-08-12 DIAGNOSIS — I10 Essential (primary) hypertension: Secondary | ICD-10-CM

## 2019-08-12 DIAGNOSIS — I517 Cardiomegaly: Secondary | ICD-10-CM

## 2019-08-12 MED ORDER — AMLODIPINE BESYLATE 10 MG PO TABS: 10.0000 mg | ORAL_TABLET | Freq: Every day | ORAL | 3 refills | Status: DC

## 2019-08-12 NOTE — Progress Notes (Signed)
Patient referred by Renee Oconnor, * for hypertension, cardiomegaly.  Subjective:   Renee Oconnor, female    DOB: 1971/07/16, 48 y.o.   MRN: [id number]   Chief Complaint  Patient presents with   cardiomegaly   New Patient (Initial Visit)    HPI  48 y.o. African-American female with uncontrolled hypertension, prediabetes, morbid obesity.  Patient was in [facility] emergency room with shortness of breath.  On presentation, she denied chest pain or shortness of breath.  Her blood pressure was elevated.  Work-up was unremarkable for acute myocardial infarction or pulmonary embolism.  She has not had any chest pain since then.  Her blood pressure remains uncontrolled. She works as a Librarian, academic at [employer], walks a lot at work, but does not get any regular exercise.  She has 2 kids, 1 of whom he she knows home schools.  Currently, she is only on lisinopril-HCTZ 20-25 mg daily for blood pressure control, along with Metformin for prediabetes, and lipitor for hyperlipidemia.    Past Medical History:  Diagnosis Date   Borderline diabetes    DIET CONTROLLED   Diabetes mellitus without complication ([place])    Eczema    High cholesterol    History of transfusion    Hypertension    Pelvic mass in female      Past Surgical History:  Procedure Laterality Date   ABDOMINAL HYSTERECTOMY Left 10/17/2015   Procedure: SUPRACERVICAL  HYSTERECTOMY bILATERAL SALPINGO OOPHORECTOMY,SMALL BOWEL RESECTION, LYSIS OF ADHESIONS ;  Surgeon: Renee Amber, MD;  Location: [facility];  Service: Gynecology;  Laterality: Left;   BOWEL RESECTION  2006   ECTOPIC PREGNANCY SURGERY     EUS N/A 12/21/2015   Procedure: LOWER ENDOSCOPIC ULTRASOUND (EUS);  Surgeon: Renee Silence, MD;  Location: [facility];  Service: Endoscopy;  Laterality: N/A;   LAPAROTOMY N/A 10/17/2015   Procedure: EXPLORATORY LAPAROTOMY ;  Surgeon: Renee Amber, MD;  Location: [facility];  Service: Gynecology;  Laterality: N/A;      Social History   Socioeconomic History   Marital status: Married    Spouse name: Not on file   Number of children: Not on file   Years of education: Not on file   Highest education level: Not on file  Occupational History   Not on file  Social Needs   Financial resource strain: Not on file   Food insecurity    Worry: Not on file    Inability: Not on file   Transportation needs    Medical: Not on file    Non-medical: Not on file  Tobacco Use   Smoking status: Never Smoker   Smokeless tobacco: Never Used  Substance and Sexual Activity   Alcohol use: No   Drug use: No   Sexual activity: Not Currently  Lifestyle   Physical activity    Days per week: Not on file    Minutes per session: Not on file   Stress: Not on file  Relationships   Social connections    Talks on phone: Not on file    Gets together: Not on file    Attends religious service: Not on file    Active member of club or organization: Not on file    Attends meetings of clubs or organizations: Not on file    Relationship status: Not on file   Intimate partner violence    Fear of current or ex partner: Not on file    Emotionally abused: Not on file  Physically abused: Not on file    Forced sexual activity: Not on file  Other Topics Concern   Not on file  Social History Narrative   Not on file     Family History  Family history unknown: Yes     Current Outpatient Medications on File Prior to Visit  Medication Sig Dispense Refill   albuterol (VENTOLIN HFA) 108 (90 Base) MCG/ACT inhaler INHALE 1 TO 2 PUFFS BY MOUTH EVERY 4 TO 6 HOURS AS NEEDED FOR SHORTNESS OF BREATH     atorvastatin (LIPITOR) 10 MG tablet Take 10 mg by mouth daily.     docusate sodium (COLACE) 100 MG capsule Take 100 mg by mouth 2 (two) times daily.     ferrous sulfate 325 (65 FE) MG tablet Take 1 tablet (325 mg total) by mouth 2 (two) times daily with a meal. 30 tablet 2    lisinopril-hydrochlorothiazide (ZESTORETIC) 20-25 MG tablet Take 1 tablet by mouth daily.     metFORMIN (GLUCOPHAGE) 1000 MG tablet Take 1,000 mg by mouth 2 (two) times daily with a meal.     polyethylene glycol (MIRALAX) packet Take 17 g by mouth daily. 14 each 0   [DISCONTINUED] amLODipine-valsartan (EXFORGE) 10-160 MG per tablet Take 1 tablet by mouth daily.     No current facility-administered medications on file prior to visit.     Cardiovascular studies:  EKG 08/12/2019: Sinus rhythm 99 bpm.  Leftward axis.    CTA chest 06/21/2019: 1. No CT evidence of central pulmonary artery embolus. 2. A 12 mm right middle lobe nodule. Follow-up with CT in 3 months recommended. 3. Areas of subpleural hazy density primarily involving the right lower lobe as well as in the lingula may represent developing infiltrate or atypical infection. Clinical correlation is Recommended.   Recent labs:   06/23/2019: HbA1C 5.9%  Results for Renee Oconnor (MRN [id number]) as of 08/12/2019 12:55  Ref. Range 06/21/2019 15:51 06/21/2019 23:00  Sodium Latest Ref Range: 135 - 145 mmol/L 141   Potassium Latest Ref Range: 3.5 - 5.1 mmol/L 3.8   Chloride Latest Ref Range: 98 - 111 mmol/L 107   CO2 Latest Ref Range: 22 - 32 mmol/L 25   Glucose Latest Ref Range: 70 - 99 mg/dL 86   BUN Latest Ref Range: 6 - 20 mg/dL 11   Creatinine Latest Ref Range: 0.44 - 1.00 mg/dL 0.79   Calcium Latest Ref Range: 8.9 - 10.3 mg/dL 9.7   Anion gap Latest Ref Range: 5 - 15  9   GFR, Est Non African American Latest Ref Range: >60 mL/min >60   GFR, Est African American Latest Ref Range: >60 mL/min >60   B Natriuretic Peptide Latest Ref Range: 0.0 - 100.0 pg/mL  90.4  Troponin I (High Sensitivity) Latest Ref Range: <18 ng/L 5 5   Results for Renee Oconnor (MRN [id number]) as of 08/12/2019 12:55  Ref. Range 06/21/2019 15:51  WBC Latest Ref Range: 4.0 - 10.5 K/uL 7.7  RBC Latest Ref Range: 3.87 - 5.11 MIL/uL 4.45   Hemoglobin Latest Ref Range: 12.0 - 15.0 g/dL 11.5 (L)  HCT Latest Ref Range: 36.0 - 46.0 % 37.2  MCV Latest Ref Range: 80.0 - 100.0 fL 83.6  MCH Latest Ref Range: 26.0 - 34.0 pg 25.8 (L)  MCHC Latest Ref Range: 30.0 - 36.0 g/dL 30.9  RDW Latest Ref Range: 11.5 - 15.5 % 16.2 (H)  Platelets Latest Ref Range: 150 - 400 K/uL 250  nRBC Latest Ref  Range: 0.0 - 0.2 % 0.0   Results for THERSEA, AUTHEMENT (MRN [id number]) as of 08/12/2019 12:55  Ref. Range 06/22/2019 00:00  D-Dimer, Quant Latest Ref Range: 0.00 - 0.50 ug/mL-FEU 0.64 (H)    Review of Systems  Constitution: Negative for decreased appetite, malaise/fatigue, weight gain and weight loss.  HENT: Negative for congestion.   Eyes: Negative for visual disturbance.  Cardiovascular: Negative for chest pain, dyspnea on exertion, leg swelling, palpitations and syncope.  Respiratory: Negative for cough.   Endocrine: Negative for cold intolerance.  Hematologic/Lymphatic: Does not bruise/bleed easily.  Skin: Negative for itching and rash.  Musculoskeletal: Negative for myalgias.  Gastrointestinal: Negative for abdominal pain, nausea and vomiting.  Genitourinary: Negative for dysuria.  Neurological: Negative for dizziness and weakness.  Psychiatric/Behavioral: The patient is not nervous/anxious.   All other systems reviewed and are negative.        Vitals:   08/12/19 1347 08/12/19 1357  BP: (!) 196/107 (!) 184/105  Pulse: (!) 102 99  SpO2: 99%      Body mass index is 49.34 kg/m. Filed Weights   08/12/19 1347  Weight: (!) 315 lb (142.9 kg)     Objective:   Physical Exam  Constitutional: She is oriented to person, place, and time. She appears well-developed and well-nourished. No distress.  Morbid obesity  HENT:  Head: Normocephalic and atraumatic.  Eyes: Pupils are equal, round, and reactive to light. Conjunctivae are normal.  Neck: No JVD present.  Cardiovascular: Normal rate, regular rhythm and intact distal pulses.   Pulmonary/Chest: Effort normal and breath sounds normal. She has no wheezes. She has no rales.  Abdominal: Soft. Bowel sounds are normal. There is no rebound.  Musculoskeletal:        General: No edema.  Lymphadenopathy:    She has no cervical adenopathy.  Neurological: She is alert and oriented to person, place, and time. No cranial nerve deficit.  Skin: Skin is warm and dry.  Psychiatric: She has a normal mood and affect.  Nursing note and vitals reviewed.         Assessment & Recommendations:   48 y.o. African-American female with uncontrolled hypertension, prediabetes, morbid obesity.  Hypertension: Uncontrolled.  She is only on 1 combination medication at this time. Added amlodipine 10 mg.  Recommend echocardiogram.  If resistant hypertension on 3/4 agents, will consider work-up for secondary hypertension.   Thank you for referring the patient to [place]. Please feel free to contact with any questions.  Diet & Lifestyle recommendations:  Physical activity recommendation (The Physical Activity Guidelines for Americans. JAMA 2018;Nov 12) At least 150-300 minutes a week of moderate-intensity, or 75-150 minutes a week of vigorous-intensity aerobic physical activity, or an equivalent combination of moderate- and vigorous-intensity aerobic activity. Adults should perform muscle-strengthening activities on 2 or more days a week. Older adults should do multicomponent physical activity that includes balance training as well as aerobic and muscle-strengthening activities. Benefits of increased physical activity include lower risk of mortality including cardiovascular mortality, lower risk of cardiovascular events and associated risk factors (hypertension and diabetes), and lower risk of many cancers (including bladder, breast, colon, endometrium, esophagus, kidney, lung, and stomach). Additional improvments have been seen in cognition, risk of dementia, anxiety and depression, improved bone  health, lower risk of falls, and associated injuries.  Dietary recommendation The 2019 ACC/AHA guidelines promote nutrition as a main fixture of cardiovascular wellness, with a recommendation for a varied diet of fruit, vegetables, fish, legumes, and whole grains (Class I),  as well as recommendations to reduce sodium, cholesterol, processed meats, and refined sugars (Class IIa recommendation).10 Sodium intake, a topic of some controversy as of late, is recommended to be kept at 1,500 mg/day or less, far below the average daily intake in the [place] of 3,409 mg/day, and notably below that of previous [place] recommendations for 300mg /day.10,11 For those unable to reach 1,500 mg/day, they recommend at least a reduction of 1000 mg/day.  A [place]-Mediterranean Diet With Intermittent Fasting: JACC Review Topic of the Week. J Am Coll Cardiol [id number] Pes[place]-Mediterranean diet, it is supplemented with extra-virgin olive oil (EVOO), which is the principle fat source, along with moderate amounts of dairy (particularly yogurt and cheese) and eggs, as well as modest amounts of alcohol consumption (ideally red wine with the evening meal), but few red and processed meats.  Nigel Mormon, MD [facility] [place] Pager: [phone] Office: [phone]

## 2019-08-12 NOTE — Patient Instructions (Addendum)
Physical activity recommendation (The Physical Activity Guidelines for Americans. JAMA 2018;Nov 12)   At least 150-300 minutes a week of moderate-intensity, or 75-150 minutes a week of vigorous-intensity aerobic physical activity, or an equivalent combination of moderate- and vigorous-intensity aerobic activity. Adults should perform muscle-strengthening activities on 2 or more days a week. Older adults should do multicomponent physical activity that includes balance training as well as aerobic and muscle-strengthening activities. Benefits of increased physical activity include lower risk of mortality including cardiovascular mortality, lower risk of cardiovascular events and associated risk factors (hypertension and diabetes), and lower risk of many cancers (including bladder, breast, colon, endometrium, esophagus, kidney, lung, and stomach). Additional improvments have been seen in cognition, risk of dementia, anxiety and depression, improved bone health, lower risk of falls, and associated injuries.  Dietary recommendation The 2019 ACC/AHA guidelines promote nutrition as a main fixture of cardiovascular wellness, with a recommendation for a varied diet of fruit, vegetables, fish, legumes, and whole grains (Class I), as well as recommendations to reduce sodium, cholesterol, processed meats, and refined sugars (Class IIa recommendation).10 Sodium intake, a topic of some controversy as of late, is recommended to be kept at 1,500 mg/day or less, far below the average daily intake in the [place] of 3,409 mg/day, and notably below that of previous [place] recommendations for 300mg /day.10,11 For those unable to reach 1,500 mg/day, they recommend at least a reduction of 1000 mg/day.  A Pesco-Mediterranean Diet With Intermittent Fasting: JACC Review Topic of the Week. J Am Coll Cardiol [id number] Pes[place]-Mediterranean diet, it is supplemented with extra-virgin olive oil (EVOO), which is the principle fat source,  along with moderate amounts of dairy (particularly yogurt and cheese) and eggs, as well as modest amounts of alcohol consumption (ideally red wine with the evening meal), but few red and processed meats.

## 2019-08-13 ENCOUNTER — Encounter: Payer: Self-pay | Admitting: Cardiology

## 2019-08-13 DIAGNOSIS — I517 Cardiomegaly: Secondary | ICD-10-CM | POA: Insufficient documentation

## 2019-08-13 DIAGNOSIS — I10 Essential (primary) hypertension: Secondary | ICD-10-CM | POA: Insufficient documentation

## 2019-08-25 ENCOUNTER — Other Ambulatory Visit: Payer: BC Managed Care – PPO

## 2019-09-02 ENCOUNTER — Other Ambulatory Visit: Payer: Self-pay

## 2019-09-02 ENCOUNTER — Ambulatory Visit (INDEPENDENT_AMBULATORY_CARE_PROVIDER_SITE_OTHER): Payer: BC Managed Care – PPO

## 2019-09-02 DIAGNOSIS — I10 Essential (primary) hypertension: Secondary | ICD-10-CM

## 2019-09-06 NOTE — Progress Notes (Signed)
Called pt to inform her about her echo results

## 2019-10-04 ENCOUNTER — Encounter: Payer: Self-pay | Admitting: Cardiology

## 2019-10-04 ENCOUNTER — Other Ambulatory Visit: Payer: Self-pay

## 2019-10-04 ENCOUNTER — Ambulatory Visit (INDEPENDENT_AMBULATORY_CARE_PROVIDER_SITE_OTHER): Payer: BC Managed Care – PPO | Admitting: Cardiology

## 2019-10-04 VITALS — BP 178/107 | HR 100 | Temp 99.0°F | Resp 16 | Ht 67.0 in | Wt 317.6 lb

## 2019-10-04 DIAGNOSIS — R911 Solitary pulmonary nodule: Secondary | ICD-10-CM | POA: Diagnosis not present

## 2019-10-04 DIAGNOSIS — I10 Essential (primary) hypertension: Secondary | ICD-10-CM

## 2019-10-04 NOTE — Progress Notes (Signed)
Patient referred by Leonard Downing, * for hypertension, cardiomegaly.  Subjective:   Renee Oconnor, female    DOB: 01/26/1971, 49 y.o.   MRN: [id number]   Chief Complaint  Patient presents with  . Hypertension    HPI  49 y.o. African-American female with uncontrolled hypertension, prediabetes, morbid obesity.  Patient admits to being very nervous and anxious today. She denies chest pain, shortness of breath, palpitations, leg edema, orthopnea, PND, TIA/syncope. She is compliant with her antihypertensive therapy. She does not check her BP at home.   Current Outpatient Medications on File Prior to Visit  Medication Sig Dispense Refill  . albuterol (VENTOLIN HFA) 108 (90 Base) MCG/ACT inhaler INHALE 1 TO 2 PUFFS BY MOUTH EVERY 4 TO 6 HOURS AS NEEDED FOR SHORTNESS OF BREATH    . amLODipine (NORVASC) 10 MG tablet Take 1 tablet (10 mg total) by mouth daily. 30 tablet 3  . atorvastatin (LIPITOR) 10 MG tablet Take 10 mg by mouth daily.    Marland Kitchen docusate sodium (COLACE) 100 MG capsule Take 100 mg by mouth 2 (two) times daily.    . ferrous sulfate 325 (65 FE) MG tablet Take 1 tablet (325 mg total) by mouth 2 (two) times daily with a meal. 30 tablet 2  . lisinopril-hydrochlorothiazide (ZESTORETIC) 20-25 MG tablet Take 1 tablet by mouth daily.    . metFORMIN (GLUCOPHAGE) 1000 MG tablet Take 1,000 mg by mouth 2 (two) times daily with a meal.    . polyethylene glycol (MIRALAX) packet Take 17 g by mouth daily. 14 each 0  . [DISCONTINUED] amLODipine-valsartan (EXFORGE) 10-160 MG per tablet Take 1 tablet by mouth daily.     No current facility-administered medications on file prior to visit.    Cardiovascular studies:  EKG 08/12/2019: Sinus rhythm 99 bpm.  Leftward axis.   CTA chest 06/21/2019: 1. No CT evidence of central pulmonary artery embolus. 2. A 12 mm right middle lobe nodule. Follow-up with CT in 3 months recommended. 3. Areas of subpleural hazy density primarily  involving the right lower lobe as well as in the lingula may represent developing infiltrate or atypical infection. Clinical correlation is Recommended.   Recent labs: 06/21/2019: Glucose 86, BUN/Cr 11/0.79. EGFR >60. Na/K 141/3.Marland KitchenRest of the CMP normal H/H 11.5/37.2. MCV 83. Platelets 250 HbA1C 5.9%  Review of Systems  Constitution: Negative for decreased appetite, malaise/fatigue, weight gain and weight loss.  HENT: Negative for congestion.   Eyes: Negative for visual disturbance.  Cardiovascular: Negative for chest pain, dyspnea on exertion, leg swelling, palpitations and syncope.  Respiratory: Negative for cough.   Endocrine: Negative for cold intolerance.  Hematologic/Lymphatic: Does not bruise/bleed easily.  Skin: Negative for itching and rash.  Musculoskeletal: Negative for myalgias.  Gastrointestinal: Negative for abdominal pain, nausea and vomiting.  Genitourinary: Negative for dysuria.  Neurological: Negative for dizziness and weakness.  Psychiatric/Behavioral: The patient is not nervous/anxious.   All other systems reviewed and are negative.        Vitals:   10/04/19 1606  BP: (!) 178/107  Pulse: 100  Resp: 16  Temp: 99 F (37.2 C)  SpO2: 100%     Body mass index is 49.74 kg/m. Filed Weights   10/04/19 1606  Weight: (!) 317 lb 9.6 oz (144.1 kg)     Objective:   Physical Exam  Constitutional: She is oriented to person, place, and time. She appears well-developed and well-nourished. No distress.  Morbid obesity  HENT:  Head: Normocephalic and atraumatic.  Eyes: Pupils are equal, round, and reactive to light. Conjunctivae are normal.  Neck: No JVD present.  Cardiovascular: Normal rate, regular rhythm and intact distal pulses.  Pulmonary/Chest: Effort normal and breath sounds normal. She has no wheezes. She has no rales.  Abdominal: Soft. Bowel sounds are normal. There is no rebound.  Musculoskeletal:        General: No edema.  Lymphadenopathy:     She has no cervical adenopathy.  Neurological: She is alert and oriented to person, place, and time. No cranial nerve deficit.  Skin: Skin is warm and dry.  Psychiatric: She has a normal mood and affect.  Nursing note and vitals reviewed.         Assessment & Recommendations:   49 y.o. African-American female with uncontrolled hypertension, prediabetes, morbid obesity.  Hypertension: Uncontrolled.  Currently on amlodipine 10 mg and lisinopril-HCTZ 20-25 mg daily. Suspect an element of white coat hypertension. Recommend regulae home monitoring. Will see her back in 4 weeks with her home monitor.  H/o lung nodule: Seen on CT chest in 06/2019. May need f/u CT chest. Will defer to PCP.   Nigel Mormon, MD [facility] [place] Pager: [phone] Office: [phone]

## 2019-11-01 ENCOUNTER — Ambulatory Visit (INDEPENDENT_AMBULATORY_CARE_PROVIDER_SITE_OTHER): Payer: BC Managed Care – PPO | Admitting: Cardiology

## 2019-11-01 ENCOUNTER — Other Ambulatory Visit: Payer: Self-pay

## 2019-11-01 ENCOUNTER — Encounter: Payer: Self-pay | Admitting: Cardiology

## 2019-11-01 VITALS — BP 174/116 | HR 111 | Temp 97.1°F | Ht 67.0 in | Wt 325.0 lb

## 2019-11-01 DIAGNOSIS — I10 Essential (primary) hypertension: Secondary | ICD-10-CM | POA: Diagnosis not present

## 2019-11-01 MED ORDER — ISOSORB DINITRATE-HYDRALAZINE 20-37.5 MG PO TABS: 1.0000 | ORAL_TABLET | Freq: Three times a day (TID) | ORAL | Status: AC | PRN

## 2019-11-01 NOTE — Progress Notes (Signed)
Patient referred by Leonard Downing, * for hypertension, cardiomegaly.  Subjective:   Renee Oconnor, female    DOB: [birth year], 49 y.o.   MRN: [id number]   Chief Complaint  Patient presents with  . Hypertension  . Follow-up    HPI  49 y.o. African-American female with uncontrolled hypertension, prediabetes, morbid obesity.  BP extremely elevated today. Home readings are near normal. BP came down after 1 pill of Bidil administered in the office. No headache, blurry vision, chest pain.  Current Outpatient Medications on File Prior to Visit  Medication Sig Dispense Refill  . albuterol (VENTOLIN HFA) 108 (90 Base) MCG/ACT inhaler INHALE 1 TO 2 PUFFS BY MOUTH EVERY 4 TO 6 HOURS AS NEEDED FOR SHORTNESS OF BREATH    . amLODipine (NORVASC) 10 MG tablet Take 1 tablet (10 mg total) by mouth daily. 30 tablet 3  . atorvastatin (LIPITOR) 10 MG tablet Take 10 mg by mouth daily.    Marland Kitchen docusate sodium (COLACE) 100 MG capsule Take 100 mg by mouth 2 (two) times daily.    . ferrous sulfate 325 (65 FE) MG tablet Take 1 tablet (325 mg total) by mouth 2 (two) times daily with a meal. 30 tablet 2  . lisinopril-hydrochlorothiazide (ZESTORETIC) 20-25 MG tablet Take 1 tablet by mouth daily.    . metFORMIN (GLUCOPHAGE) 1000 MG tablet Take 1,000 mg by mouth 2 (two) times daily with a meal.    . polyethylene glycol (MIRALAX) packet Take 17 g by mouth daily. 14 each 0  . [DISCONTINUED] amLODipine-valsartan (EXFORGE) 10-160 MG per tablet Take 1 tablet by mouth daily.     No current facility-administered medications on file prior to visit.    Cardiovascular studies:  EKG 08/12/2019: Sinus rhythm 99 bpm.  Leftward axis.   CTA chest 06/21/2019: 1. No CT evidence of central pulmonary artery embolus. 2. A 12 mm right middle lobe nodule. Follow-up with CT in 3 months recommended. 3. Areas of subpleural hazy density primarily involving the right lower lobe as well as in the lingula may represent  developing infiltrate or atypical infection. Clinical correlation is Recommended.   Recent labs: 06/21/2019: Glucose 86, BUN/Cr 11/0.79. EGFR >60. Na/K 141/3.Marland KitchenRest of the CMP normal H/H 11.5/37.2. MCV 83. Platelets 250 HbA1C 5.9%  Review of Systems  Cardiovascular: Negative for chest pain, dyspnea on exertion, leg swelling, palpitations and syncope.         Vitals:   11/01/19 1632 11/01/19 1646  BP: (!) 196/114 (!) 174/116  Pulse: (!) 115 (!) 111  Temp:    SpO2:       Body mass index is 50.9 kg/m. Filed Weights   11/01/19 1624  Weight: (!) 325 lb (147.4 kg)     Objective:   Physical Exam  Constitutional: She appears well-developed and well-nourished.  Neck: No JVD present.  Cardiovascular: Normal rate, regular rhythm, normal heart sounds and intact distal pulses.  No murmur heard. Pulmonary/Chest: Effort normal and breath sounds normal. She has no wheezes. She has no rales.  Musculoskeletal:        General: No edema.  Nursing note and vitals reviewed.         Assessment & Recommendations:   49 y.o. African-American female with uncontrolled hypertension, prediabetes, morbid obesity.  Hypertension: Uncontrolled.  Currently on amlodipine 10 mg and lisinopril-HCTZ 20-25 mg daily. Suspect an element of white coat hypertension. Recommend regulae home monitoring. Gave Bidil for emergency use. I will seeher back in 4 weeks for virtual follow  up.  Morbid obesity: Likely contributing to hypertension. Recommend calorie negative diet and regular walking.   H/o lung nodule: Seen on CT chest in 06/2019. May need f/u CT chest. Will defer to PCP.   Nigel Mormon, MD [facility] Pager: [phone] Office: [phone]

## 2019-12-08 ENCOUNTER — Other Ambulatory Visit: Payer: Self-pay

## 2019-12-08 ENCOUNTER — Telehealth: Payer: BC Managed Care – PPO | Admitting: Cardiology

## 2019-12-08 DIAGNOSIS — R911 Solitary pulmonary nodule: Secondary | ICD-10-CM

## 2019-12-08 DIAGNOSIS — I10 Essential (primary) hypertension: Secondary | ICD-10-CM

## 2019-12-08 MED ORDER — AMLODIPINE BESYLATE 10 MG PO TABS: 10.0000 mg | ORAL_TABLET | Freq: Every day | ORAL | 2 refills | Status: DC

## 2019-12-08 NOTE — Progress Notes (Addendum)
Patient referred by Leonard Downing, * for hypertension, cardiomegaly.  Subjective:   Renee Oconnor, female    DOB: [birth year], 49 y.o.   MRN: [id number]   Chief Complaint  Patient presents with  . Hypertension  . Follow-up    4 week  . Telephone Screen    49 y.o. African-American female with hypertension, prediabetes, morbid obesity.  BP was extremely elevated at last office visit. I had given her Bidil 1 tablet in the office and given her the bottle for as needed use.  Sj  Home readings are near normal. BP came down after 1 pill of Bidil administered in the office. No headache, blurry vision, chest pain. She has not had to use BiDil.  Blood pressure is much better controlled. She denies chest pain, shortness of breath, palpitations, leg edema, orthopnea, PND, TIA/syncope.    Current Outpatient Medications on File Prior to Visit  Medication Sig Dispense Refill  . albuterol (VENTOLIN HFA) 108 (90 Base) MCG/ACT inhaler INHALE 1 TO 2 PUFFS BY MOUTH EVERY 4 TO 6 HOURS AS NEEDED FOR SHORTNESS OF BREATH    . amLODipine (NORVASC) 10 MG tablet Take 1 tablet (10 mg total) by mouth daily. 30 tablet 3  . atorvastatin (LIPITOR) 10 MG tablet Take 10 mg by mouth daily.    Marland Kitchen docusate sodium (COLACE) 100 MG capsule Take 100 mg by mouth 2 (two) times daily.    . ferrous sulfate 325 (65 FE) MG tablet Take 1 tablet (325 mg total) by mouth 2 (two) times daily with a meal. 30 tablet 2  . lisinopril-hydrochlorothiazide (ZESTORETIC) 20-25 MG tablet Take 1 tablet by mouth daily.    . metFORMIN (GLUCOPHAGE) 1000 MG tablet Take 1,000 mg by mouth 2 (two) times daily with a meal.    . polyethylene glycol (MIRALAX) packet Take 17 g by mouth daily. 14 each 0  . [DISCONTINUED] amLODipine-valsartan (EXFORGE) 10-160 MG per tablet Take 1 tablet by mouth daily.     Current Facility-Administered Medications on File Prior to Visit  Medication Dose Route Frequency Provider Last Rate Last Admin  .  isosorbide-hydrALAZINE (BIDIL) 20-37.5 MG per tablet 1 tablet  1 tablet Oral TID PRN Nigel Mormon, MD        Cardiovascular studies:  EKG 08/12/2019: Sinus rhythm 99 bpm.  Leftward axis.   CTA chest 06/21/2019: 1. No CT evidence of central pulmonary artery embolus. 2. A 12 mm right middle lobe nodule. Follow-up with CT in 3 months recommended. 3. Areas of subpleural hazy density primarily involving the right lower lobe as well as in the lingula may represent developing infiltrate or atypical infection. Clinical correlation is Recommended.   Recent labs: 06/21/2019: Glucose 86, BUN/Cr 11/0.79. EGFR >60. Na/K 141/3.Marland KitchenRest of the CMP normal H/H 11.5/37.2. MCV 83. Platelets 250 HbA1C 5.9%  Review of Systems  Cardiovascular: Negative for chest pain, dyspnea on exertion, leg swelling, palpitations and syncope.         Vitals:   12/01/19 1253 12/06/19 1252  BP: (!) 149/81 119/73      Objective:   Physical Exam   Not performed. Telephone visit.        Assessment & Recommendations:   49 y.o. African-American female with uncontrolled hypertension, prediabetes, morbid obesity.  Hypertension: Better controlled, on amlodipine 10 mg and lisinopril-HCTZ 20-25 mg daily. Suspect an element of white coat hypertension. Recommend regular home monitoring.   Morbid obesity: Likely contributing to hypertension. Recommend calorie negative diet and regular walking.  H/o lung nodule: Seen on CT chest in 06/2019. May need f/u CT chest. Will defer to PCP.   I will see her back in 6 months.   Nigel Mormon, MD [facility] Pager: [phone] Office: [phone]

## 2020-06-08 NOTE — Progress Notes (Signed)
No show

## 2020-06-09 ENCOUNTER — Ambulatory Visit: Payer: BC Managed Care – PPO | Admitting: Cardiology

## 2020-08-14 ENCOUNTER — Other Ambulatory Visit: Payer: Self-pay | Admitting: Cardiology

## 2020-08-14 DIAGNOSIS — I10 Essential (primary) hypertension: Secondary | ICD-10-CM

## 2021-08-11 IMAGING — CT CT ANGIO CHEST
2 of 7 series · 17 of 46 positions shown · IV contrast (omnipaque)
Comparison: Chest radiograph dated 06/21/2019

CLINICAL DATA: 47-year-old female with shortness of breath and
elevated D-dimer. Concern for pulmonary embolism.

EXAM:
CT ANGIOGRAPHY CHEST WITH CONTRAST
TECHNIQUE: Multidetector CT imaging of the chest was performed using the
standard protocol during bolus administration of intravenous
contrast. Multiplanar CT image reconstructions and MIPs were
obtained to evaluate the vascular anatomy.
CONTRAST:  80mL OMNIPAQUE IOHEXOL 350 MG/ML SOLN

[Series 7: thins · axial · 0.79mm/px · z∈[-395,-145]mm · 14 of 403 slices shown]
[im 23/403  lung]
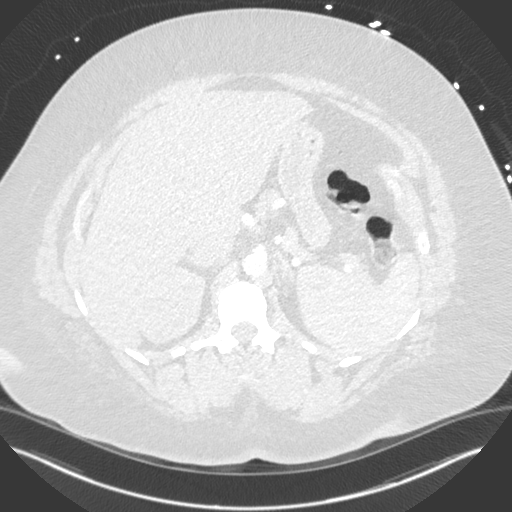
[im 45/403  soft-tissue]
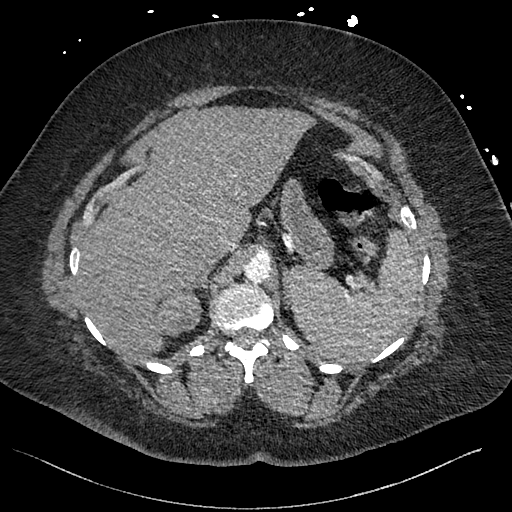
[im 90/403  lung]
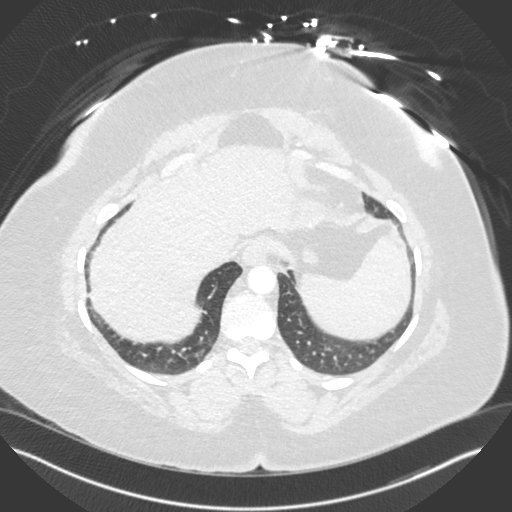
[im 112/403  soft-tissue]
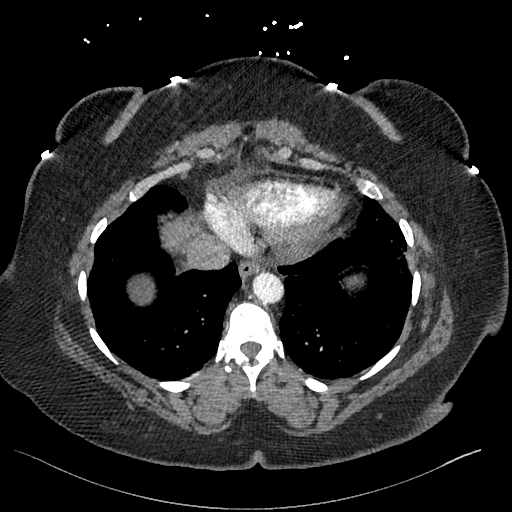
[im 135/403  lung]
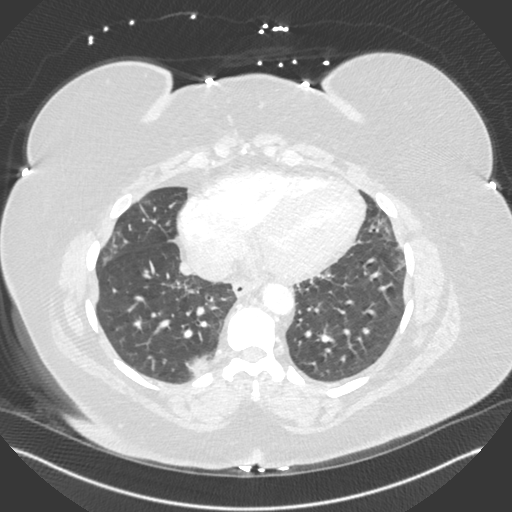
[im 157/403  soft-tissue]
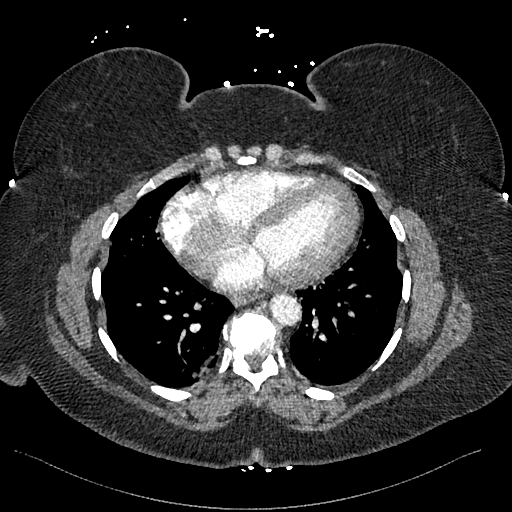
[im 179/403  lung]
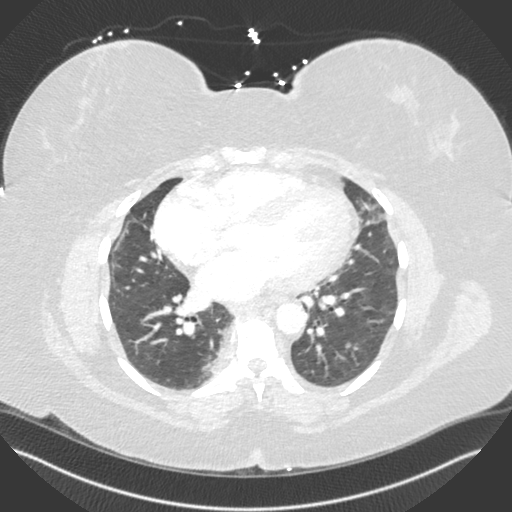
[im 224/403  soft-tissue]
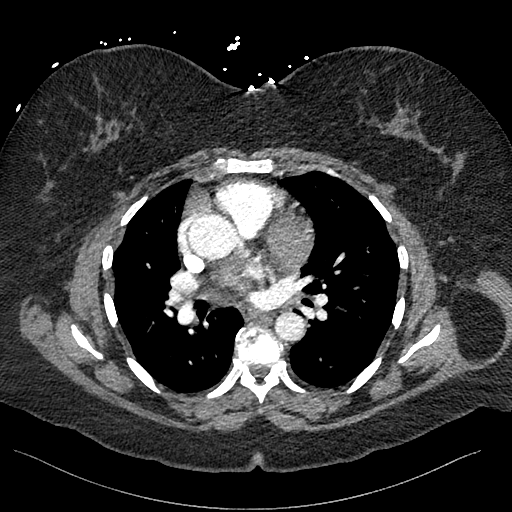
[im 246/403  lung]
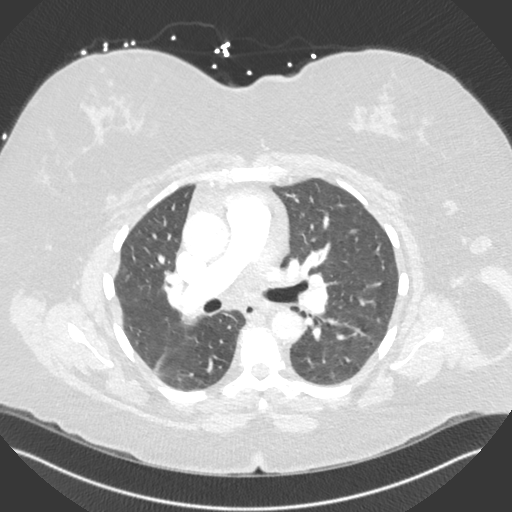
[im 269/403  soft-tissue]
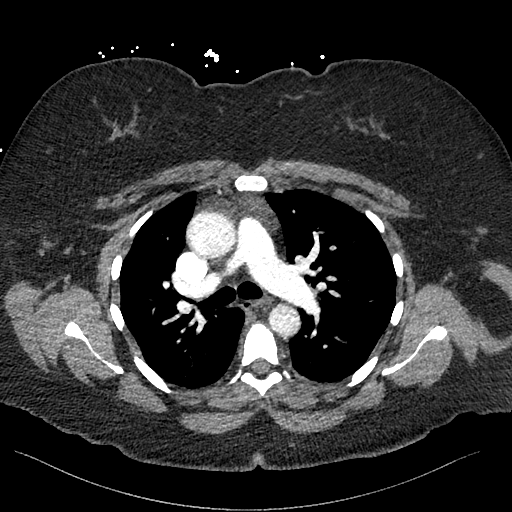
[im 291/403  lung]
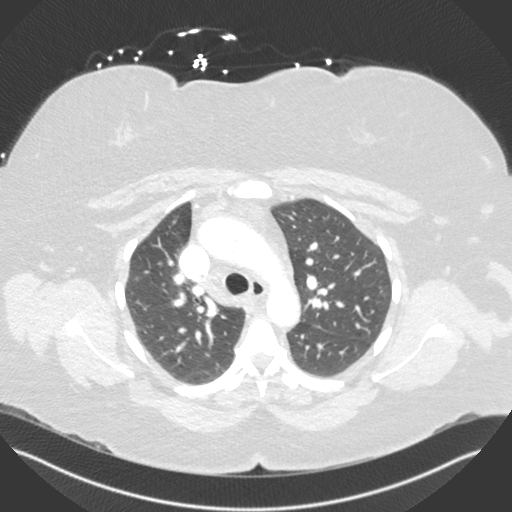
[im 313/403  soft-tissue]
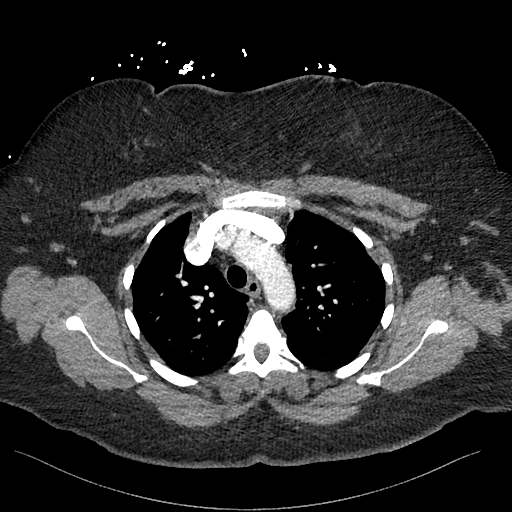
[im 358/403  lung]
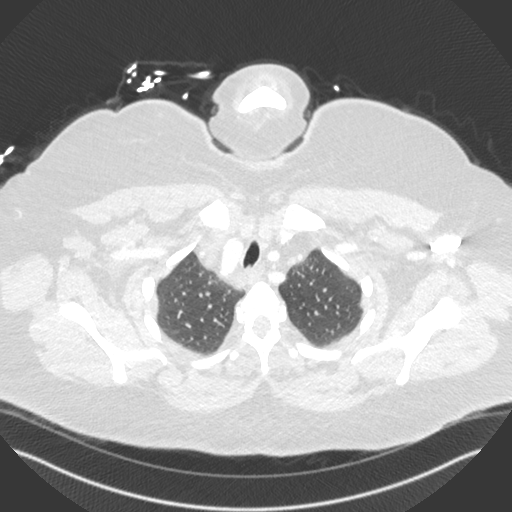
[im 380/403  soft-tissue]
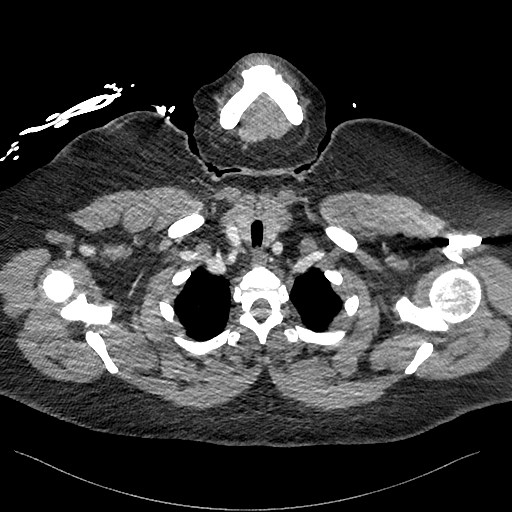

[Series 8: cor · coronal · 0.59mm/px · 3 of 187 slices shown]
[im 47/187  soft-tissue]
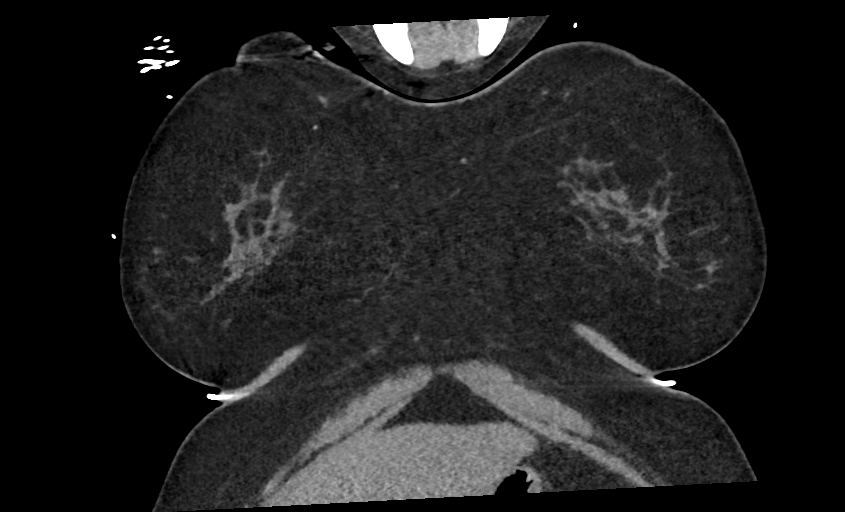
[im 94/187  soft-tissue]
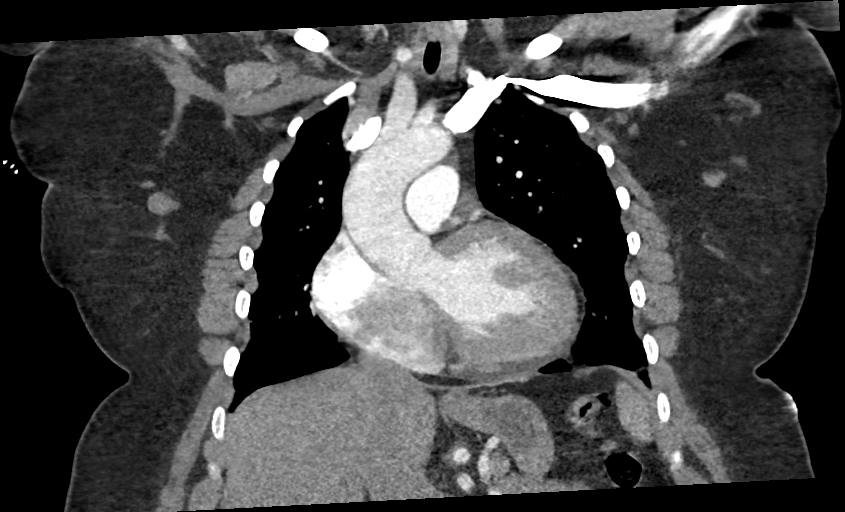
[im 140/187  soft-tissue]
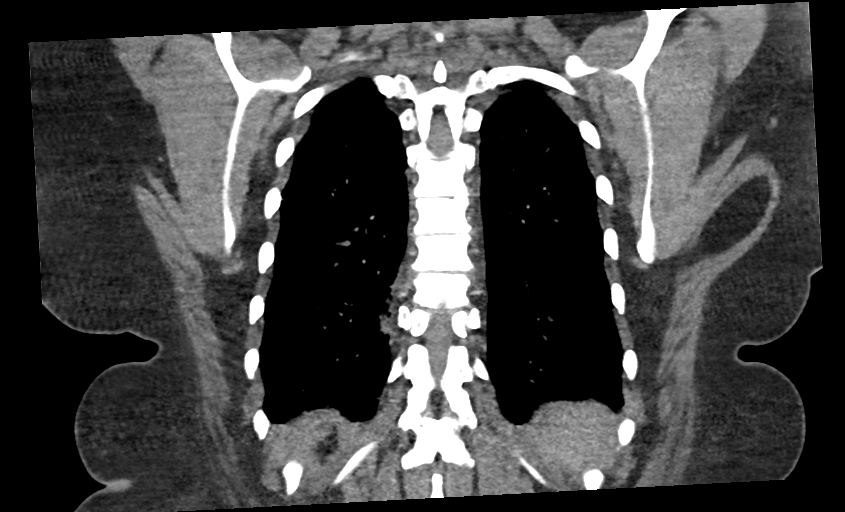

[17 of 46 positions shown; findings below may reference images not displayed]

FINDINGS: Cardiovascular: Borderline cardiomegaly. No pericardial effusion.
The thoracic aorta is unremarkable. Evaluation of the pulmonary
arteries is limited due to suboptimal opacification and timing of
the contrast. No definite large or central pulmonary artery embolus
identified.

Mediastinum/Nodes: There is no hilar or mediastinal adenopathy. The
esophagus is grossly unremarkable. No mediastinal fluid collection.

Lungs/Pleura: There is a 12 mm nodule in the right middle lobe
(series 6 image 76). Areas of subpleural hazy density primarily
involving the medial aspect the right lower lobe as well as in the
lingula noted which may represent developing infiltrate or atypical
infection. Clinical correlation is recommended. There is no pleural
effusion or pneumothorax. The central airways are patent.

Upper Abdomen: Faint splenic hypodense lesions are not
characterized. In defined low attenuating area in the upper pole of
the right kidney is not well visualized or characterized.

Musculoskeletal: Degenerative changes of the spine. No acute osseous
pathology.

Review of the MIP images confirms the above findings.
IMPRESSION: 1. No CT evidence of central pulmonary artery embolus.
2. A 12 mm right middle lobe nodule. Follow-up with CT in 3 months
recommended.
3. Areas of subpleural hazy density primarily involving the right
lower lobe as well as in the lingula may represent developing
infiltrate or atypical infection. Clinical correlation is
recommended.

Aortic Atherosclerosis ([id number]-K63.3).
